# Patient Record
Sex: Female | Born: 1960 | Hispanic: No | Marital: Married | Smoking: Former smoker
Health system: Southern US, Community
[De-identification: ages and names within clinical notes are randomized; demographics above are authoritative.]

## PROBLEM LIST (undated history)

## (undated) DIAGNOSIS — D649 Anemia, unspecified: Secondary | ICD-10-CM

## (undated) HISTORY — PX: BREAST SURGERY: SHX581

---

## 2003-05-03 DIAGNOSIS — I219 Acute myocardial infarction, unspecified: Secondary | ICD-10-CM

## 2003-05-03 HISTORY — DX: Acute myocardial infarction, unspecified: I21.9

## 2012-12-28 ENCOUNTER — Ambulatory Visit: Payer: Self-pay | Admitting: Hematology and Oncology

## 2012-12-28 LAB — CBC CANCER CENTER
Basophil %: 1.3 %
HCT: 34.2 % — ABNORMAL LOW (ref 35.0–47.0)
HGB: 11.2 g/dL — ABNORMAL LOW (ref 12.0–16.0)
Lymphocyte #: 1.8 x10 3/mm (ref 1.0–3.6)
Lymphocyte %: 24.8 %
MCHC: 32.8 g/dL (ref 32.0–36.0)
MCV: 70 fL — ABNORMAL LOW (ref 80–100)
Monocyte #: 0.5 x10 3/mm (ref 0.2–0.9)
Neutrophil #: 4.7 x10 3/mm (ref 1.4–6.5)
Neutrophil %: 63.4 %
Platelet: 263 x10 3/mm (ref 150–440)
RBC: 4.88 10*6/uL (ref 3.80–5.20)
WBC: 7.4 x10 3/mm (ref 3.6–11.0)

## 2012-12-31 ENCOUNTER — Ambulatory Visit: Payer: Self-pay | Admitting: Hematology and Oncology

## 2013-01-02 ENCOUNTER — Encounter: Payer: Self-pay | Admitting: *Deleted

## 2013-01-23 ENCOUNTER — Ambulatory Visit: Payer: Self-pay | Admitting: General Surgery

## 2013-01-25 LAB — CBC CANCER CENTER
Basophil #: 0.1 x10 3/mm (ref 0.0–0.1)
Basophil %: 1.2 %
HCT: 38.4 % (ref 35.0–47.0)
HGB: 12.4 g/dL (ref 12.0–16.0)
Lymphocyte %: 23.9 %
MCH: 23.9 pg — ABNORMAL LOW (ref 26.0–34.0)
MCHC: 32.2 g/dL (ref 32.0–36.0)
Monocyte #: 0.4 x10 3/mm (ref 0.2–0.9)
Monocyte %: 6.2 %
Neutrophil #: 4.2 x10 3/mm (ref 1.4–6.5)
RDW: 20.5 % — ABNORMAL HIGH (ref 11.5–14.5)
WBC: 6.5 x10 3/mm (ref 3.6–11.0)

## 2013-01-25 LAB — RETICULOCYTES: Reticulocyte: 2.01 % (ref 0.4–3.1)

## 2013-01-25 LAB — FERRITIN: Ferritin (ARMC): 364 ng/mL (ref 8–388)

## 2013-01-25 LAB — IRON AND TIBC: Iron Saturation: 54 %

## 2013-01-30 ENCOUNTER — Ambulatory Visit: Payer: Self-pay | Admitting: Hematology and Oncology

## 2013-02-19 LAB — CBC CANCER CENTER
Eosinophil #: 0.2 x10 3/mm (ref 0.0–0.7)
Eosinophil %: 3.3 %
HCT: 40 % (ref 35.0–47.0)
HGB: 13.2 g/dL (ref 12.0–16.0)
Lymphocyte %: 23.7 %
MCH: 26 pg (ref 26.0–34.0)
MCHC: 33.1 g/dL (ref 32.0–36.0)
MCV: 79 fL — ABNORMAL LOW (ref 80–100)
Monocyte #: 0.4 x10 3/mm (ref 0.2–0.9)
Neutrophil #: 3.5 x10 3/mm (ref 1.4–6.5)
Platelet: 235 x10 3/mm (ref 150–440)
RBC: 5.09 10*6/uL (ref 3.80–5.20)
WBC: 5.4 x10 3/mm (ref 3.6–11.0)

## 2013-02-19 LAB — IRON AND TIBC
Iron Saturation: 63 %
Iron: 138 ug/dL (ref 50–170)

## 2013-02-19 LAB — RETICULOCYTES: Absolute Retic Count: 0.0876 10*6/uL (ref 0.019–0.186)

## 2013-02-21 ENCOUNTER — Encounter: Payer: Self-pay | Admitting: *Deleted

## 2013-03-02 ENCOUNTER — Ambulatory Visit: Payer: Self-pay | Admitting: Hematology and Oncology

## 2013-03-22 LAB — CBC CANCER CENTER
Basophil #: 0.1 x10 3/mm (ref 0.0–0.1)
Basophil %: 1.3 %
Eosinophil #: 0.2 x10 3/mm (ref 0.0–0.7)
Eosinophil %: 4 %
HCT: 38.3 % (ref 35.0–47.0)
HGB: 12.5 g/dL (ref 12.0–16.0)
Lymphocyte #: 1.5 x10 3/mm (ref 1.0–3.6)
Lymphocyte %: 24.4 %
MCH: 26.4 pg (ref 26.0–34.0)
MCHC: 32.7 g/dL (ref 32.0–36.0)
Monocyte #: 0.4 x10 3/mm (ref 0.2–0.9)
Monocyte %: 6.4 %
Neutrophil %: 63.9 %
Platelet: 202 x10 3/mm (ref 150–440)
RBC: 4.76 10*6/uL (ref 3.80–5.20)
RDW: 18.3 % — ABNORMAL HIGH (ref 11.5–14.5)
WBC: 6 x10 3/mm (ref 3.6–11.0)

## 2013-03-22 LAB — IRON AND TIBC
Iron Bind.Cap.(Total): 225 ug/dL — ABNORMAL LOW (ref 250–450)
Iron Saturation: 54 %
Iron: 121 ug/dL (ref 50–170)
Unbound Iron-Bind.Cap.: 104 ug/dL

## 2013-03-22 LAB — FERRITIN: Ferritin (ARMC): 545 ng/mL — ABNORMAL HIGH (ref 8–388)

## 2013-03-22 LAB — RETICULOCYTES: Reticulocyte: 1.88 % (ref 0.4–3.1)

## 2013-04-01 ENCOUNTER — Ambulatory Visit: Payer: Self-pay | Admitting: Hematology and Oncology

## 2013-04-08 ENCOUNTER — Ambulatory Visit: Payer: Self-pay | Admitting: Obstetrics and Gynecology

## 2013-04-08 LAB — COMPREHENSIVE METABOLIC PANEL
Albumin: 3.5 g/dL (ref 3.4–5.0)
Alkaline Phosphatase: 81 U/L
Anion Gap: 1 — ABNORMAL LOW (ref 7–16)
Bilirubin,Total: 0.3 mg/dL (ref 0.2–1.0)
Calcium, Total: 9 mg/dL (ref 8.5–10.1)
Chloride: 106 mmol/L (ref 98–107)
Co2: 32 mmol/L (ref 21–32)
EGFR (African American): >60
EGFR (Non-African Amer.): >60
Glucose: 96 mg/dL (ref 65–99)
Potassium: 4 mmol/L (ref 3.5–5.1)
Sodium: 139 mmol/L (ref 136–145)

## 2013-04-08 LAB — CBC
HGB: 13.5 g/dL (ref 12.0–16.0)
MCH: 27.1 pg (ref 26.0–34.0)
MCHC: 33.5 g/dL (ref 32.0–36.0)
MCV: 81 fL (ref 80–100)
Platelet: 207 10*3/uL (ref 150–440)
RBC: 5 10*6/uL (ref 3.80–5.20)
RDW: 16.1 % — ABNORMAL HIGH (ref 11.5–14.5)
WBC: 6.4 10*3/uL (ref 3.6–11.0)

## 2013-04-18 ENCOUNTER — Ambulatory Visit: Payer: Self-pay | Admitting: Obstetrics and Gynecology

## 2013-04-19 LAB — CBC
HCT: 37.3 % (ref 35.0–47.0)
MCH: 27.1 pg (ref 26.0–34.0)
MCV: 81 fL (ref 80–100)
Platelet: 217 10*3/uL (ref 150–440)
RBC: 4.64 10*6/uL (ref 3.80–5.20)
WBC: 11.4 10*3/uL — ABNORMAL HIGH (ref 3.6–11.0)

## 2013-04-19 LAB — BASIC METABOLIC PANEL
Anion Gap: 5 — ABNORMAL LOW (ref 7–16)
BUN: 8 mg/dL (ref 7–18)
Calcium, Total: 8.5 mg/dL (ref 8.5–10.1)
Co2: 28 mmol/L (ref 21–32)
EGFR (African American): >60
Osmolality: 282 (ref 275–301)
Potassium: 3.7 mmol/L (ref 3.5–5.1)
Sodium: 141 mmol/L (ref 136–145)

## 2013-04-19 LAB — MAGNESIUM: Magnesium: 1.6 mg/dL — ABNORMAL LOW

## 2013-04-29 LAB — PATHOLOGY REPORT

## 2013-05-02 HISTORY — PX: ABDOMINAL HYSTERECTOMY: SHX81

## 2013-06-20 ENCOUNTER — Ambulatory Visit: Payer: Self-pay | Admitting: Hematology and Oncology

## 2013-07-05 ENCOUNTER — Ambulatory Visit: Payer: Self-pay | Admitting: Hematology and Oncology

## 2013-07-05 LAB — CBC CANCER CENTER
Basophil #: 0.1 x10 3/mm (ref 0.0–0.1)
Basophil %: 1.1 %
EOS PCT: 6.4 %
Eosinophil #: 0.4 x10 3/mm (ref 0.0–0.7)
HCT: 40.4 % (ref 35.0–47.0)
HGB: 13.3 g/dL (ref 12.0–16.0)
Lymphocyte #: 1.5 x10 3/mm (ref 1.0–3.6)
Lymphocyte %: 24.9 %
MCH: 26.8 pg (ref 26.0–34.0)
MCHC: 33 g/dL (ref 32.0–36.0)
MCV: 82 fL (ref 80–100)
MONO ABS: 0.4 x10 3/mm (ref 0.2–0.9)
MONOS PCT: 6.5 %
Neutrophil #: 3.7 x10 3/mm (ref 1.4–6.5)
Neutrophil %: 61.1 %
PLATELETS: 222 x10 3/mm (ref 150–440)
RBC: 4.95 10*6/uL (ref 3.80–5.20)
RDW: 13.5 % (ref 11.5–14.5)
WBC: 6 x10 3/mm (ref 3.6–11.0)

## 2013-07-05 LAB — IRON AND TIBC
Iron Bind.Cap.(Total): 231 ug/dL — ABNORMAL LOW (ref 250–450)
Iron Saturation: 53 %
Iron: 122 ug/dL (ref 50–170)
Unbound Iron-Bind.Cap.: 109 ug/dL

## 2013-07-05 LAB — FERRITIN: Ferritin (ARMC): 488 ng/mL — ABNORMAL HIGH (ref 8–388)

## 2013-07-05 LAB — RETICULOCYTES
ABSOLUTE RETIC COUNT: 0.0943 10*6/uL (ref 0.019–0.186)
RETICULOCYTE: 1.9 % (ref 0.4–3.1)

## 2013-07-31 ENCOUNTER — Ambulatory Visit: Payer: Self-pay | Admitting: Hematology and Oncology

## 2013-10-03 ENCOUNTER — Ambulatory Visit: Payer: Self-pay | Admitting: Hematology and Oncology

## 2013-10-03 LAB — CBC CANCER CENTER
Basophil #: 0.1 x10 3/mm (ref 0.0–0.1)
Basophil %: 0.7 %
EOS ABS: 0.3 x10 3/mm (ref 0.0–0.7)
Eosinophil %: 3.8 %
HCT: 39.5 % (ref 35.0–47.0)
HGB: 13.3 g/dL (ref 12.0–16.0)
Lymphocyte #: 1.8 x10 3/mm (ref 1.0–3.6)
Lymphocyte %: 23.1 %
MCH: 27.5 pg (ref 26.0–34.0)
MCHC: 33.6 g/dL (ref 32.0–36.0)
MCV: 82 fL (ref 80–100)
MONOS PCT: 6.1 %
Monocyte #: 0.5 x10 3/mm (ref 0.2–0.9)
Neutrophil #: 5.2 x10 3/mm (ref 1.4–6.5)
Neutrophil %: 66.3 %
Platelet: 243 x10 3/mm (ref 150–440)
RBC: 4.83 10*6/uL (ref 3.80–5.20)
RDW: 13.9 % (ref 11.5–14.5)
WBC: 7.9 x10 3/mm (ref 3.6–11.0)

## 2013-10-03 LAB — IRON AND TIBC
IRON BIND. CAP.(TOTAL): 234 ug/dL — AB (ref 250–450)
IRON: 82 ug/dL (ref 50–170)
Iron Saturation: 35 %
Unbound Iron-Bind.Cap.: 152 ug/dL

## 2013-10-30 ENCOUNTER — Ambulatory Visit: Payer: Self-pay | Admitting: Hematology and Oncology

## 2014-03-11 ENCOUNTER — Ambulatory Visit: Payer: Self-pay

## 2014-05-20 ENCOUNTER — Ambulatory Visit (INDEPENDENT_AMBULATORY_CARE_PROVIDER_SITE_OTHER): Payer: No Typology Code available for payment source | Admitting: Podiatry

## 2014-05-20 ENCOUNTER — Encounter: Payer: Self-pay | Admitting: Podiatry

## 2014-05-20 ENCOUNTER — Ambulatory Visit (INDEPENDENT_AMBULATORY_CARE_PROVIDER_SITE_OTHER): Payer: No Typology Code available for payment source

## 2014-05-20 VITALS — BP 122/72 | HR 71 | Resp 16 | Ht 63.0 in | Wt 270.0 lb

## 2014-05-20 DIAGNOSIS — M779 Enthesopathy, unspecified: Secondary | ICD-10-CM

## 2014-05-20 DIAGNOSIS — M722 Plantar fascial fibromatosis: Secondary | ICD-10-CM

## 2014-05-20 DIAGNOSIS — M8430XA Stress fracture, unspecified site, initial encounter for fracture: Secondary | ICD-10-CM

## 2014-05-20 NOTE — Progress Notes (Signed)
   Subjective:    Patient ID: Adrienne Chapman, female    DOB: 06/26/1960, 54 y.o.   MRN: [id number]  HPI Comments: 54 year old female presents the office today with complaints of bilateral heel pain. She states that the left is typically worse in the right however the right tends to get more swollen. She states that she has pain in the mornings or after periods of prolonged activity. She states that once she sits down for a period time is difficult to get up and start ambulating. She states that she has had pain for the last 5 months and has been progressive. She denies any history of injury or trauma. She does that her weight has fluctuated and she recently lost a lot of weight however has since gained some back. She's been soaking her feet in Epson salts. No other treatments. No other complaints at this time.  Foot Pain Associated symptoms include fatigue.      Review of Systems  Constitutional: Positive for fatigue.  Gastrointestinal:       Urgency   Endocrine:       Increase urination   Musculoskeletal:       Joint pain Difficulty walking   Skin:       Change in nails   Hematological: Bruises/bleeds easily.  All other systems reviewed and are negative.      Objective:   Physical Exam AAO 3, NAD DP/PT pulses palpable bilaterally, CRT less than 3 seconds Protective sensation appears to be intact with Dorann OuSimms Weinstein monofilament, vibratory sensation intact, Achilles tendon reflex intact. There is tenderness to palpation overlying the plantar medial tubercle bilateral calcaneus at the insertion the plantar fascia. There is no pain along the posterior aspect of the calcaneus or along the insertion of the Achilles tendon. On the left side there is no pain with lateral compression of the calcaneus and no pain with vibratory sensation. On the right side is pain with lateral compression of the calcaneus there is also discomfort with vibratory sensation. There is overlying edema of  the lateral aspect of the foot on the right side more than the left. No overlying erythema or increase in warmth. No other tenderness to bilateral lower extremities. MMT 5/5, ROM WNL No pain with calf compression, swelling, warmth, erythema. No open lesions or pre-ulcerative lesions are identified.       Assessment & Plan:  54 year old female with right calcaneal stress fracture, left foot plantar fasciitis -X-rays were obtained and reviewed the patient. On the right side there does appear to be a sclerotic line within the calcaneus which is consistent with a stress fracture. No evidence of fracture this time on the left. -Treatment options were discussed including alternatives, risks, complications. -For the left-sided plantar fasciitis discussed icing, stretching exercises. A plantar fascial taping was applied. Discussed possible injection however she wishes to hold off at that time. Discussed shoe gear modifications and possible orthotics. -On the right side given the evidence of stress fracture and x-ray dispensed CAM boot for a prescription for new scooter was also given to her and she states that she would not do well with crutches.  -Follow-up in 2 weeks for repeat x-ray on the right. Will consider MRI at that time. In the meantime, cursory call the office with any questions, concerns, change in symptoms. Follow-up with PCP for other issues mentioned in the review of systems. Also discussed the patient to follow-up with PCP there will other causes of the stress fracture such as osteoporosis.

## 2014-05-20 NOTE — Patient Instructions (Signed)
Only do these exercises on the left foot. Wear the boot on the right due to stress fracture of your heel bone.     Achilles Tendinitis  with Rehab Achilles tendinitis is a disorder of the Achilles tendon. The Achilles tendon connects the large calf muscles (Gastrocnemius and Soleus) to the heel bone (calcaneus). This tendon is sometimes called the heel cord. It is important for pushing-off and standing on your toes and is important for walking, running, or jumping. Tendinitis is often caused by overuse and repetitive microtrauma. SYMPTOMS  Pain, tenderness, swelling, warmth, and redness may occur over the Achilles tendon even at rest.  Pain with pushing off, or flexing or extending the ankle.  Pain that is worsened after or during activity. CAUSES   Overuse sometimes seen with rapid increase in exercise programs or in sports requiring running and jumping.  Poor physical conditioning (strength and flexibility or endurance).  Running sports, especially training running down hills.  Inadequate warm-up before practice or play or failure to stretch before participation.  Injury to the tendon. PREVENTION   Warm up and stretch before practice or competition.  Allow time for adequate rest and recovery between practices and competition.  Keep up conditioning.  Keep up ankle and leg flexibility.  Improve or keep muscle strength and endurance.  Improve cardiovascular fitness.  Use proper technique.  Use proper equipment (shoes, skates).  To help prevent recurrence, taping, protective strapping, or an adhesive bandage may be recommended for several weeks after healing is complete. PROGNOSIS   Recovery may take weeks to several months to heal.  Longer recovery is expected if symptoms have been prolonged.  Recovery is usually quicker if the inflammation is due to a direct blow as compared with overuse or sudden strain. RELATED COMPLICATIONS   Healing time will be prolonged if  the condition is not correctly treated. The injury must be given plenty of time to heal.  Symptoms can reoccur if activity is resumed too soon.  Untreated, tendinitis may increase the risk of tendon rupture requiring additional time for recovery and possibly surgery. TREATMENT   The first treatment consists of rest anti-inflammatory medication, and ice to relieve the pain.  Stretching and strengthening exercises after resolution of pain will likely help reduce the risk of recurrence. Referral to a physical therapist or athletic trainer for further evaluation and treatment may be helpful.  A walking boot or cast may be recommended to rest the Achilles tendon. This can help break the cycle of inflammation and microtrauma.  Arch supports (orthotics) may be prescribed or recommended by your caregiver as an adjunct to therapy and rest.  Surgery to remove the inflamed tendon lining or degenerated tendon tissue is rarely necessary and has shown less than predictable results. MEDICATION   Nonsteroidal anti-inflammatory medications, such as aspirin and ibuprofen, may be used for pain and inflammation relief. Do not take within 7 days before surgery. Take these as directed by your caregiver. Contact your caregiver immediately if any bleeding, stomach upset, or signs of allergic reaction occur. Other minor pain relievers, such as acetaminophen, may also be used.  Pain relievers may be prescribed as necessary by your caregiver. Do not take prescription pain medication for longer than 4 to 7 days. Use only as directed and only as much as you need.  Cortisone injections are rarely indicated. Cortisone injections may weaken tendons and predispose to rupture. It is better to give the condition more time to heal than to use them. HEAT AND  COLD  Cold is used to relieve pain and reduce inflammation for acute and chronic Achilles tendinitis. Cold should be applied for 10 to 15 minutes every 2 to 3 hours for  inflammation and pain and immediately after any activity that aggravates your symptoms. Use ice packs or an ice massage.  Heat may be used before performing stretching and strengthening activities prescribed by your caregiver. Use a heat pack or a warm soak. SEEK MEDICAL CARE IF:  Symptoms get worse or do not improve in 2 weeks despite treatment.  New, unexplained symptoms develop. Drugs used in treatment may produce side effects. EXERCISES RANGE OF MOTION (ROM) AND STRETCHING EXERCISES - Achilles Tendinitis  These exercises may help you when beginning to rehabilitate your injury. Your symptoms may resolve with or without further involvement from your physician, physical therapist or athletic trainer. While completing these exercises, remember:   Restoring tissue flexibility helps normal motion to return to the joints. This allows healthier, less painful movement and activity.  An effective stretch should be held for at least 30 seconds.  A stretch should never be painful. You should only feel a gentle lengthening or release in the stretched tissue. STRETCH - Gastroc, Standing   Place hands on wall.  Extend right / left leg, keeping the front knee somewhat bent.  Slightly point your toes inward on your back foot.  Keeping your right / left heel on the floor and your knee straight, shift your weight toward the wall, not allowing your back to arch.  You should feel a gentle stretch in the right / left calf. Hold this position for __________ seconds. Repeat __________ times. Complete this stretch __________ times per day. STRETCH - Soleus, Standing   Place hands on wall.  Extend right / left leg, keeping the other knee somewhat bent.  Slightly point your toes inward on your back foot.  Keep your right / left heel on the floor, bend your back knee, and slightly shift your weight over the back leg so that you feel a gentle stretch deep in your back calf.  Hold this position for  __________ seconds. Repeat __________ times. Complete this stretch __________ times per day. STRETCH - Gastrocsoleus, Standing  Note: This exercise can place a lot of stress on your foot and ankle. Please complete this exercise only if specifically instructed by your caregiver.   Place the ball of your right / left foot on a step, keeping your other foot firmly on the same step.  Hold on to the wall or a rail for balance.  Slowly lift your other foot, allowing your body weight to press your heel down over the edge of the step.  You should feel a stretch in your right / left calf.  Hold this position for __________ seconds.  Repeat this exercise with a slight bend in your knee. Repeat __________ times. Complete this stretch __________ times per day.  STRENGTHENING EXERCISES - Achilles Tendinitis These exercises may help you when beginning to rehabilitate your injury. They may resolve your symptoms with or without further involvement from your physician, physical therapist or athletic trainer. While completing these exercises, remember:   Muscles can gain both the endurance and the strength needed for everyday activities through controlled exercises.  Complete these exercises as instructed by your physician, physical therapist or athletic trainer. Progress the resistance and repetitions only as guided.  You may experience muscle soreness or fatigue, but the pain or discomfort you are trying to eliminate should never  worsen during these exercises. If this pain does worsen, stop and make certain you are following the directions exactly. If the pain is still present after adjustments, discontinue the exercise until you can discuss the trouble with your clinician. STRENGTH - Plantar-flexors   Sit with your right / left leg extended. Holding onto both ends of a rubber exercise band/tubing, loop it around the ball of your foot. Keep a slight tension in the band.  Slowly push your toes away from  you, pointing them downward.  Hold this position for __________ seconds. Return slowly, controlling the tension in the band/tubing. Repeat __________ times. Complete this exercise __________ times per day.  STRENGTH - Plantar-flexors   Stand with your feet shoulder width apart. Steady yourself with a wall or table using as little support as needed.  Keeping your weight evenly spread over the width of your feet, rise up on your toes.*  Hold this position for __________ seconds. Repeat __________ times. Complete this exercise __________ times per day.  *If this is too easy, shift your weight toward your right / left leg until you feel challenged. Ultimately, you may be asked to do this exercise with your right / left foot only. STRENGTH - Plantar-flexors, Eccentric  Note: This exercise can place a lot of stress on your foot and ankle. Please complete this exercise only if specifically instructed by your caregiver.   Place the balls of your feet on a step. With your hands, use only enough support from a wall or rail to keep your balance.  Keep your knees straight and rise up on your toes.  Slowly shift your weight entirely to your right / left toes and pick up your opposite foot. Gently and with controlled movement, lower your weight through your right / left foot so that your heel drops below the level of the step. You will feel a slight stretch in the back of your calf at the end position.  Use the healthy leg to help rise up onto the balls of both feet, then lower weight only on the right / left leg again. Build up to 15 repetitions. Then progress to 3 consecutive sets of 15 repetitions.*  After completing the above exercise, complete the same exercise with a slight knee bend (about 30 degrees). Again, build up to 15 repetitions. Then progress to 3 consecutive sets of 15 repetitions.* Perform this exercise __________ times per day.  *When you easily complete 3 sets of 15, your physician,  physical therapist or athletic trainer may advise you to add resistance by wearing a backpack filled with additional weight. STRENGTH - Plantar Flexors, Seated   Sit on a chair that allows your feet to rest flat on the ground. If necessary, sit at the edge of the chair.  Keeping your toes firmly on the ground, lift your right / left heel as far as you can without increasing any discomfort in your ankle. Repeat __________ times. Complete this exercise __________ times a day. *If instructed by your physician, physical therapist or athletic trainer, you may add ____________________ of resistance by placing a weighted object on your right / left knee. Document Released: 11/17/2004 Document Revised: 07/11/2011 Document Reviewed: 07/31/2008 [facility] Patient Information 2015 [place], [place]. This information is not intended to replace advice given to you by your health care provider. Make sure you discuss any questions you have with your health care provider. Plantar Fasciitis (Heel Spur Syndrome) with Rehab The plantar fascia is a fibrous, ligament-like, soft-tissue structure that  spans the bottom of the foot. Plantar fasciitis is a condition that causes pain in the foot due to inflammation of the tissue. SYMPTOMS   Pain and tenderness on the underneath side of the foot.  Pain that worsens with standing or walking. CAUSES  Plantar fasciitis is caused by irritation and injury to the plantar fascia on the underneath side of the foot. Common mechanisms of injury include:  Direct trauma to bottom of the foot.  Damage to a small nerve that runs under the foot where the main fascia attaches to the heel bone.  Stress placed on the plantar fascia due to bone spurs. RISK INCREASES WITH:   Activities that place stress on the plantar fascia (running, jumping, pivoting, or cutting).  Poor strength and flexibility.  Improperly fitted shoes.  Tight calf muscles.  Flat feet.  Failure to warm-up  properly before activity.  Obesity. PREVENTION  Warm up and stretch properly before activity.  Allow for adequate recovery between workouts.  Maintain physical fitness:  Strength, flexibility, and endurance.  Cardiovascular fitness.  Maintain a health body weight.  Avoid stress on the plantar fascia.  Wear properly fitted shoes, including arch supports for individuals who have flat feet. PROGNOSIS  If treated properly, then the symptoms of plantar fasciitis usually resolve without surgery. However, occasionally surgery is necessary. RELATED COMPLICATIONS   Recurrent symptoms that may result in a chronic condition.  Problems of the lower back that are caused by compensating for the injury, such as limping.  Pain or weakness of the foot during push-off following surgery.  Chronic inflammation, scarring, and partial or complete fascia tear, occurring more often from repeated injections. TREATMENT  Treatment initially involves the use of ice and medication to help reduce pain and inflammation. The use of strengthening and stretching exercises may help reduce pain with activity, especially stretches of the Achilles tendon. These exercises may be performed at home or with a therapist. Your caregiver may recommend that you use heel cups of arch supports to help reduce stress on the plantar fascia. Occasionally, corticosteroid injections are given to reduce inflammation. If symptoms persist for greater than 6 months despite non-surgical (conservative), then surgery may be recommended.  MEDICATION   If pain medication is necessary, then nonsteroidal anti-inflammatory medications, such as aspirin and ibuprofen, or other minor pain relievers, such as acetaminophen, are often recommended.  Do not take pain medication within 7 days before surgery.  Prescription pain relievers may be given if deemed necessary by your caregiver. Use only as directed and only as much as you  need.  Corticosteroid injections may be given by your caregiver. These injections should be reserved for the most serious cases, because they may only be given a certain number of times. HEAT AND COLD  Cold treatment (icing) relieves pain and reduces inflammation. Cold treatment should be applied for 10 to 15 minutes every 2 to 3 hours for inflammation and pain and immediately after any activity that aggravates your symptoms. Use ice packs or massage the area with a piece of ice (ice massage).  Heat treatment may be used prior to performing the stretching and strengthening activities prescribed by your caregiver, physical therapist, or athletic trainer. Use a heat pack or soak the injury in warm water. SEEK IMMEDIATE MEDICAL CARE IF:  Treatment seems to offer no benefit, or the condition worsens.  Any medications produce adverse side effects. EXERCISES RANGE OF MOTION (ROM) AND STRETCHING EXERCISES - Plantar Fasciitis (Heel Spur Syndrome) These exercises may help  you when beginning to rehabilitate your injury. Your symptoms may resolve with or without further involvement from your physician, physical therapist or athletic trainer. While completing these exercises, remember:   Restoring tissue flexibility helps normal motion to return to the joints. This allows healthier, less painful movement and activity.  An effective stretch should be held for at least 30 seconds.  A stretch should never be painful. You should only feel a gentle lengthening or release in the stretched tissue. RANGE OF MOTION - Toe Extension, Flexion  Sit with your right / left leg crossed over your opposite knee.  Grasp your toes and gently pull them back toward the top of your foot. You should feel a stretch on the bottom of your toes and/or foot.  Hold this stretch for __________ seconds.  Now, gently pull your toes toward the bottom of your foot. You should feel a stretch on the top of your toes and or  foot.  Hold this stretch for __________ seconds. Repeat __________ times. Complete this stretch __________ times per day.  RANGE OF MOTION - Ankle Dorsiflexion, Active Assisted  Remove shoes and sit on a chair that is preferably not on a carpeted surface.  Place right / left foot under knee. Extend your opposite leg for support.  Keeping your heel down, slide your right / left foot back toward the chair until you feel a stretch at your ankle or calf. If you do not feel a stretch, slide your bottom forward to the edge of the chair, while still keeping your heel down.  Hold this stretch for __________ seconds. Repeat __________ times. Complete this stretch __________ times per day.  STRETCH - Gastroc, Standing  Place hands on wall.  Extend right / left leg, keeping the front knee somewhat bent.  Slightly point your toes inward on your back foot.  Keeping your right / left heel on the floor and your knee straight, shift your weight toward the wall, not allowing your back to arch.  You should feel a gentle stretch in the right / left calf. Hold this position for __________ seconds. Repeat __________ times. Complete this stretch __________ times per day. STRETCH - Soleus, Standing  Place hands on wall.  Extend right / left leg, keeping the other knee somewhat bent.  Slightly point your toes inward on your back foot.  Keep your right / left heel on the floor, bend your back knee, and slightly shift your weight over the back leg so that you feel a gentle stretch deep in your back calf.  Hold this position for __________ seconds. Repeat __________ times. Complete this stretch __________ times per day. STRETCH - Gastrocsoleus, Standing  Note: This exercise can place a lot of stress on your foot and ankle. Please complete this exercise only if specifically instructed by your caregiver.   Place the ball of your right / left foot on a step, keeping your other foot firmly on the same  step.  Hold on to the wall or a rail for balance.  Slowly lift your other foot, allowing your body weight to press your heel down over the edge of the step.  You should feel a stretch in your right / left calf.  Hold this position for __________ seconds.  Repeat this exercise with a slight bend in your right / left knee. Repeat __________ times. Complete this stretch __________ times per day.  STRENGTHENING EXERCISES - Plantar Fasciitis (Heel Spur Syndrome)  These exercises may help you when  beginning to rehabilitate your injury. They may resolve your symptoms with or without further involvement from your physician, physical therapist or athletic trainer. While completing these exercises, remember:   Muscles can gain both the endurance and the strength needed for everyday activities through controlled exercises.  Complete these exercises as instructed by your physician, physical therapist or athletic trainer. Progress the resistance and repetitions only as guided. STRENGTH - Towel Curls  Sit in a chair positioned on a non-carpeted surface.  Place your foot on a towel, keeping your heel on the floor.  Pull the towel toward your heel by only curling your toes. Keep your heel on the floor.  If instructed by your physician, physical therapist or athletic trainer, add ____________________ at the end of the towel. Repeat __________ times. Complete this exercise __________ times per day. STRENGTH - Ankle Inversion  Secure one end of a rubber exercise band/tubing to a fixed object (table, pole). Loop the other end around your foot just before your toes.  Place your fists between your knees. This will focus your strengthening at your ankle.  Slowly, pull your big toe up and in, making sure the band/tubing is positioned to resist the entire motion.  Hold this position for __________ seconds.  Have your muscles resist the band/tubing as it slowly pulls your foot back to the starting  position. Repeat __________ times. Complete this exercises __________ times per day.  Document Released: 04/18/2005 Document Revised: 07/11/2011 Document Reviewed: 07/31/2008 [facility] Patient Information 2015 [place], [place]. This information is not intended to replace advice given to you by your health care provider. Make sure you discuss any questions you have with your health care provider.

## 2014-06-05 ENCOUNTER — Ambulatory Visit (INDEPENDENT_AMBULATORY_CARE_PROVIDER_SITE_OTHER): Payer: No Typology Code available for payment source | Admitting: Podiatry

## 2014-06-05 ENCOUNTER — Encounter: Payer: Self-pay | Admitting: Podiatry

## 2014-06-05 ENCOUNTER — Ambulatory Visit (INDEPENDENT_AMBULATORY_CARE_PROVIDER_SITE_OTHER): Payer: No Typology Code available for payment source

## 2014-06-05 VITALS — BP 121/54 | HR 64 | Resp 18

## 2014-06-05 DIAGNOSIS — L988 Other specified disorders of the skin and subcutaneous tissue: Secondary | ICD-10-CM

## 2014-06-05 DIAGNOSIS — M722 Plantar fascial fibromatosis: Secondary | ICD-10-CM

## 2014-06-05 DIAGNOSIS — M8430XA Stress fracture, unspecified site, initial encounter for fracture: Secondary | ICD-10-CM

## 2014-06-05 NOTE — Progress Notes (Signed)
Patient ID: Adrienne MannanFrancesca Chapman, female   DOB: [birth year], 54 y.o.   MRN: [id number]  Subjective: 54 year old female returns the office today for follow-up of bilateral heel pain. She states of the left side is doing well and has improved in symptoms although she does continue to have intermittent discomfort at times. She does that the right heel continues to hurt somewhat as well. She does that she wears the boot the majority the time however she does go without it at times. She was not wearing a today's appointment. On the left side she has been continuing with stretching exercises and icing. No other complaints at this time in no acute changes since last appointment.  Objective: AAO 3, NAD DP/PT pulses palpable bilaterally, CRT less than 3 seconds Protective sensation intact with Simms Weinstein monofilament, vibratory sensation intact, Achilles tendon reflex intact. On the left side there is tenderness palpation overlying the plantar medial tubercle of the calcaneus at the insertion of the plantar fascia. There is no pain on the course of plantar fascial within the arch of the foot. No pain with lateral compression of the calcaneus or pain with vibratory sensation. No pain on the posterior aspect of the calcaneus or along the course/insertion of the Achilles tendon. On the right side there is tenderness palpation overlying the plantar aspect of the calcaneus as well as lateral aspect of the calcaneus. There is tenderness vibratory sensation over on the calcaneus. There is mild overlying the edema however there is no overlying erythema or increase in warmth. No pain along the posterior aspect of the calcaneus. No other areas of tenderness to bilateral lower extremities. There is a small amount of maceration left first interspace. Subjectively she states that her feet sweat a lot. No other areas of rash to bilateral lower extremities. No pain with calf compression, swelling, warmth,  erythema.  Assessment: 54 year old female with bilateral heel pain, left likely plantar fasciitis, right likely stress fracture  Plan: -X-rays were obtained and reviewed of the right foot. There does continue be a sclerotic line within the calcaneus and given her symptoms will continue treatment for stress fracture. Due to these findings however we will obtain an MRI to further evaluate the area. Continue with the Cam Walker on the right side. -On the left continue a stretching exercises as well as icing and the plantar fascial brace. Again offered a steroid injection into the area however she declined. -Discussed with her multiple ways to help control sweating to her feet. Castellani's pain was applied and the left first interspace. -Follow-up after MRI or sooner if any problems are to arise. In the meantime, encouraged to call the office with any questions, concerns, change in symptoms.

## 2014-06-24 ENCOUNTER — Ambulatory Visit (INDEPENDENT_AMBULATORY_CARE_PROVIDER_SITE_OTHER): Payer: No Typology Code available for payment source | Admitting: Podiatry

## 2014-06-24 ENCOUNTER — Encounter: Payer: Self-pay | Admitting: Podiatry

## 2014-06-24 VITALS — BP 135/75 | HR 71 | Resp 18

## 2014-06-24 DIAGNOSIS — M722 Plantar fascial fibromatosis: Secondary | ICD-10-CM

## 2014-06-24 DIAGNOSIS — M8430XD Stress fracture, unspecified site, subsequent encounter for fracture with routine healing: Secondary | ICD-10-CM

## 2014-06-24 MED ORDER — TRIAMCINOLONE ACETONIDE 10 MG/ML IJ SUSP
10.0000 mg | Freq: Once | INTRAMUSCULAR | Status: AC
  Administered 2014-06-24: 10 mg

## 2014-06-24 NOTE — Patient Instructions (Signed)
If you have not heard from someone in regards to your MRI within the next couple of days, please call the office. Continue to wear the boot on the right.

## 2014-06-25 NOTE — Progress Notes (Signed)
Patient ID: Adrienne Chapman, female   DOB: [birth year], 54 y.o.   MRN: [id number]  Subjective: 54 year old female returns the office they for follow-up evaluation of bilateral heel pain. She states that the right side continues to be painful Morton the left. She states that she has pain in the mornings which is relieved by ambulation. She states of the right side is a more constant throbbing. She also states that the right side does have intermittent swelling. She denies any overlying redness or warmth. She wears the cam walker intermittently. She presents today wearing her regular shoe on both feet. She is requesting a steroid injection at this time. Also she states that she never received a call to schedule the MRI. No other complaints at this time in no acute changes since last appointment. Denies any systemic complaints as fevers, chills, nausea, vomiting.  Objective: AAO 3, NAD DP/PT pulses palpable, CRT less than 3 seconds Protective sensation intact with Simms Weinstein monofilament, vibratory sensation intact, Achilles tendon reflex intact. There is tenderness palpation upon the plantar medial tubercle of the calcaneus at the insertion of the plantar fascia on the left foot. There is no pain along the course of plantar fascial in the arch of the foot on the left and the plantar fascia appears to be intact. There is no pain with lateral compression of the calcaneus or pain with vibratory sensation. No pain along the posterior course of the calcaneus or along the course/insertional Achilles tendon. No overlying edema, erythema, increase in warmth. On the right side there is tenderness to palpation along the plantar aspect of the calcaneus. There is also mild tenderness with lateral compression of the calcaneus and there is continued discomfort with vibratory sensation. There is mild overlying edema to the left rear foot. There is no overlying erythema or increase in warmth. No other areas of  tenderness to bilateral lower extremities. MMT 5/5, ROM WNL No pain with calf compression, swelling, warmth, erythema. No open lesions or pre-ulcerative lesions bilaterally.  Assessment: 54 year old female bilateral heel pain. Left-sided likely plantar fasciitis right side without stress fracture.   Plan: -Treatment options were discussed the patient with alternatives, risks, complications. -At this time patient continues Symptoms of stress fracturing right side. Recommended MRI. I re-completed paperwork today for the MRI. Will follow-up. Discussed the patient she has not heard from our office within the next 2 days to call the office to follow-up. For now, continue with CAM walker on the right side at all times. -On the left side symptoms are most likely result of plantar fasciitis. Patient elects to proceed with steroid injection into the left heel. Under sterile skin preparation, a total of 2.5cc of kenalog 10, 0.5% Marcaine plain, and 2% lidocaine plain were infiltrated into the symptomatic area without complication. A band-aid was applied. Patient tolerated the injection well without complication. Post-injection care with discussed with the patient. Discussed with the patient to ice the area over the next couple of days to help prevent a steroid flare. Continue stretching icing activities. The plantar fascial taping was also applied today. Once the tape is removed she can continue with the plantar fascial brace. -Follow-up after MRI. In the meantime, encouraged to call the office with any questions, concerns, change in symptoms.

## 2014-07-01 ENCOUNTER — Other Ambulatory Visit: Payer: Self-pay | Admitting: Podiatry

## 2014-07-01 DIAGNOSIS — M8430XD Stress fracture, unspecified site, subsequent encounter for fracture with routine healing: Secondary | ICD-10-CM

## 2014-07-02 ENCOUNTER — Encounter: Payer: Self-pay | Admitting: Podiatry

## 2014-07-02 NOTE — Progress Notes (Unsigned)
AUTH #  [id number]   FOR MRI OF RIGHT FOOT AND ANKLE GOOD THRU 07/02/14- 08/02/14

## 2014-07-07 ENCOUNTER — Ambulatory Visit
Admission: RE | Admit: 2014-07-07 | Discharge: 2014-07-07 | Disposition: A | Discharge status: Home / Self Care | Payer: No Typology Code available for payment source | Source: Ambulatory Visit | Attending: Podiatry | Admitting: Podiatry

## 2014-07-07 DIAGNOSIS — M8430XD Stress fracture, unspecified site, subsequent encounter for fracture with routine healing: Secondary | ICD-10-CM

## 2014-07-09 ENCOUNTER — Telehealth: Payer: Self-pay | Admitting: Podiatry

## 2014-07-09 NOTE — Telephone Encounter (Signed)
Left message for patient to set up appt with Dr.Wagoner to go over MRI results.

## 2014-07-17 ENCOUNTER — Ambulatory Visit (INDEPENDENT_AMBULATORY_CARE_PROVIDER_SITE_OTHER): Payer: No Typology Code available for payment source | Admitting: Podiatry

## 2014-07-17 VITALS — BP 118/75 | HR 60 | Resp 16

## 2014-07-17 DIAGNOSIS — M722 Plantar fascial fibromatosis: Secondary | ICD-10-CM

## 2014-07-17 MED ORDER — TRIAMCINOLONE ACETONIDE 10 MG/ML IJ SUSP
10.0000 mg | Freq: Once | INTRAMUSCULAR | Status: AC
Start: 2014-07-17 — End: 2014-07-17
  Administered 2014-07-17: 10 mg

## 2014-07-17 MED ORDER — MELOXICAM 7.5 MG PO TABS: 7.5000 mg | ORAL_TABLET | Freq: Every day | ORAL | Status: DC

## 2014-07-17 NOTE — Patient Instructions (Signed)
Plantar Fasciitis (Heel Spur Syndrome) with Rehab The plantar fascia is a fibrous, ligament-like, soft-tissue structure that spans the bottom of the foot. Plantar fasciitis is a condition that causes pain in the foot due to inflammation of the tissue. SYMPTOMS   Pain and tenderness on the underneath side of the foot.  Pain that worsens with standing or walking. CAUSES  Plantar fasciitis is caused by irritation and injury to the plantar fascia on the underneath side of the foot. Common mechanisms of injury include:  Direct trauma to bottom of the foot.  Damage to a small nerve that runs under the foot where the main fascia attaches to the heel bone.  Stress placed on the plantar fascia due to bone spurs. RISK INCREASES WITH:   Activities that place stress on the plantar fascia (running, jumping, pivoting, or cutting).  Poor strength and flexibility.  Improperly fitted shoes.  Tight calf muscles.  Flat feet.  Failure to warm-up properly before activity.  Obesity. PREVENTION  Warm up and stretch properly before activity.  Allow for adequate recovery between workouts.  Maintain physical fitness:  Strength, flexibility, and endurance.  Cardiovascular fitness.  Maintain a health body weight.  Avoid stress on the plantar fascia.  Wear properly fitted shoes, including arch supports for individuals who have flat feet. PROGNOSIS  If treated properly, then the symptoms of plantar fasciitis usually resolve without surgery. However, occasionally surgery is necessary. RELATED COMPLICATIONS   Recurrent symptoms that may result in a chronic condition.  Problems of the lower back that are caused by compensating for the injury, such as limping.  Pain or weakness of the foot during push-off following surgery.  Chronic inflammation, scarring, and partial or complete fascia tear, occurring more often from repeated injections. TREATMENT  Treatment initially involves the use of  ice and medication to help reduce pain and inflammation. The use of strengthening and stretching exercises may help reduce pain with activity, especially stretches of the Achilles tendon. These exercises may be performed at home or with a therapist. Your caregiver may recommend that you use heel cups of arch supports to help reduce stress on the plantar fascia. Occasionally, corticosteroid injections are given to reduce inflammation. If symptoms persist for greater than 6 months despite non-surgical (conservative), then surgery may be recommended.  MEDICATION   If pain medication is necessary, then nonsteroidal anti-inflammatory medications, such as aspirin and ibuprofen, or other minor pain relievers, such as acetaminophen, are often recommended.  Do not take pain medication within 7 days before surgery.  Prescription pain relievers may be given if deemed necessary by your caregiver. Use only as directed and only as much as you need.  Corticosteroid injections may be given by your caregiver. These injections should be reserved for the most serious cases, because they may only be given a certain number of times. HEAT AND COLD  Cold treatment (icing) relieves pain and reduces inflammation. Cold treatment should be applied for 10 to 15 minutes every 2 to 3 hours for inflammation and pain and immediately after any activity that aggravates your symptoms. Use ice packs or massage the area with a piece of ice (ice massage).  Heat treatment may be used prior to performing the stretching and strengthening activities prescribed by your caregiver, physical therapist, or athletic trainer. Use a heat pack or soak the injury in warm water. SEEK IMMEDIATE MEDICAL CARE IF:  Treatment seems to offer no benefit, or the condition worsens.  Any medications produce adverse side effects. EXERCISES RANGE   OF MOTION (ROM) AND STRETCHING EXERCISES - Plantar Fasciitis (Heel Spur Syndrome) These exercises may help you  when beginning to rehabilitate your injury. Your symptoms may resolve with or without further involvement from your physician, physical therapist or athletic trainer. While completing these exercises, remember:   Restoring tissue flexibility helps normal motion to return to the joints. This allows healthier, less painful movement and activity.  An effective stretch should be held for at least 30 seconds.  A stretch should never be painful. You should only feel a gentle lengthening or release in the stretched tissue. RANGE OF MOTION - Toe Extension, Flexion  Sit with your right / left leg crossed over your opposite knee.  Grasp your toes and gently pull them back toward the top of your foot. You should feel a stretch on the bottom of your toes and/or foot.  Hold this stretch for __________ seconds.  Now, gently pull your toes toward the bottom of your foot. You should feel a stretch on the top of your toes and or foot.  Hold this stretch for __________ seconds. Repeat __________ times. Complete this stretch __________ times per day.  RANGE OF MOTION - Ankle Dorsiflexion, Active Assisted  Remove shoes and sit on a chair that is preferably not on a carpeted surface.  Place right / left foot under knee. Extend your opposite leg for support.  Keeping your heel down, slide your right / left foot back toward the chair until you feel a stretch at your ankle or calf. If you do not feel a stretch, slide your bottom forward to the edge of the chair, while still keeping your heel down.  Hold this stretch for __________ seconds. Repeat __________ times. Complete this stretch __________ times per day.  STRETCH - Gastroc, Standing  Place hands on wall.  Extend right / left leg, keeping the front knee somewhat bent.  Slightly point your toes inward on your back foot.  Keeping your right / left heel on the floor and your knee straight, shift your weight toward the wall, not allowing your back to  arch.  You should feel a gentle stretch in the right / left calf. Hold this position for __________ seconds. Repeat __________ times. Complete this stretch __________ times per day. STRETCH - Soleus, Standing  Place hands on wall.  Extend right / left leg, keeping the other knee somewhat bent.  Slightly point your toes inward on your back foot.  Keep your right / left heel on the floor, bend your back knee, and slightly shift your weight over the back leg so that you feel a gentle stretch deep in your back calf.  Hold this position for __________ seconds. Repeat __________ times. Complete this stretch __________ times per day. STRETCH - Gastrocsoleus, Standing  Note: This exercise can place a lot of stress on your foot and ankle. Please complete this exercise only if specifically instructed by your caregiver.   Place the ball of your right / left foot on a step, keeping your other foot firmly on the same step.  Hold on to the wall or a rail for balance.  Slowly lift your other foot, allowing your body weight to press your heel down over the edge of the step.  You should feel a stretch in your right / left calf.  Hold this position for __________ seconds.  Repeat this exercise with a slight bend in your right / left knee. Repeat __________ times. Complete this stretch __________ times per day.    STRENGTHENING EXERCISES - Plantar Fasciitis (Heel Spur Syndrome)  These exercises may help you when beginning to rehabilitate your injury. They may resolve your symptoms with or without further involvement from your physician, physical therapist or athletic trainer. While completing these exercises, remember:   Muscles can gain both the endurance and the strength needed for everyday activities through controlled exercises.  Complete these exercises as instructed by your physician, physical therapist or athletic trainer. Progress the resistance and repetitions only as guided. STRENGTH -  Towel Curls  Sit in a chair positioned on a non-carpeted surface.  Place your foot on a towel, keeping your heel on the floor.  Pull the towel toward your heel by only curling your toes. Keep your heel on the floor.  If instructed by your physician, physical therapist or athletic trainer, add ____________________ at the end of the towel. Repeat __________ times. Complete this exercise __________ times per day. STRENGTH - Ankle Inversion  Secure one end of a rubber exercise band/tubing to a fixed object (table, pole). Loop the other end around your foot just before your toes.  Place your fists between your knees. This will focus your strengthening at your ankle.  Slowly, pull your big toe up and in, making sure the band/tubing is positioned to resist the entire motion.  Hold this position for __________ seconds.  Have your muscles resist the band/tubing as it slowly pulls your foot back to the starting position. Repeat __________ times. Complete this exercises __________ times per day.  Document Released: 04/18/2005 Document Revised: 07/11/2011 Document Reviewed: 07/31/2008 [employer] Patient Information 2015 [employer]. This information is not intended to replace advice given to you by your health care provider. Make sure you discuss any questions you have with your health care provider.  

## 2014-07-17 NOTE — Progress Notes (Signed)
Patient ID: Adrienne Chapman, female   DOB: 1960/06/25, 54 y.o.   MRN: [id number]  Subjective: 54 year old female presents the office today for follow-up evaluation of bilateral heel pain and to discuss MRI results. Although they do continue to be sore at times. She states that she has pain in the bottoms of her heel. She states that we have been aggravate it this past week as she was moving houses. She's been continuing stretching icing x-rays as much as possible. Denies a numbness or tingling. Denies use swelling redness. No other complaints at this time.  Objective: AAO x3, NAD DP/PT pulses palpable bilaterally, CRT less than 3 seconds Protective sensation intact with Simms Weinstein monofilament, vibratory sensation intact, Achilles tendon reflex intact Tenderness to palpation overlying the plantar medial tubercle of the calcaneus to bilateral heels at the insertion of the plantar fascia. There is no pain along the course of plantar fascial within the arch of the foot and the plantar fascia appears intact.  There is no pain with lateral compression of the calcaneus or pain the vibratory sensation today bilaterally. No pain on the posterior aspect of the calcaneus or along the course/insertion of the Achilles tendon. There is mild discomfort along the course the peroneal tendons posterior inferior to the lateral malleolus. There is no pain along the insertion into the fifth metatarsal base. There is no pain with eversion. The peroneal's. Be intact. There is no overlying edema, erythema, increase in warmth. No other areas of tenderness palpation or pain with vibratory sensation to the foot/ankle. MMT 5/5, ROM WNL No open lesions or pre-ulcerative lesions are identified. No pain with calf compression, swelling, warmth, erythema.  MRI right foot/ankle:  1. Mild midfoot degenerative changes but no metatarsal stress fracture findings for inflammatory arthropathy. There is mild intermetatarsal bursitis  between the third and fourth metatarsal heads. 2. Mild peroneal tenosynovitis 3. Plantar fasciitis 4. No stress fracture or osteochondral lesions.  Assessment: 54 year old female bilateral plantar fasciitis  Plan: -MRI results were discussed the patient -Treatment options were discussed include alternatives, risks, complications. -Patient elects to proceed with steroid injection into the bilateral heels. Under sterile skin preparation, a total of 2.5cc of kenalog 10, 0.5% Marcaine plain, and 2% lidocaine plain were infiltrated into the symptomatic area without complication. A band-aid was applied. Patient tolerated the injection well without complication. Post-injection care with discussed with the patient. Discussed with the patient to ice the area over the next couple of days to help prevent a steroid flare.  -Plantar fascial brace was dispensed 2. -Continue stretching activities. -Ice to the area. -Prescribed meloxicam. Discussed side effects and directed to call the office should any occur.  -Discussed shoe gear modifications and possible orthotics. -Follow-up in 3 weeks or sooner if any problems are to arise. In the meantime encouraged to call the office with any questions, concerns, change in symptoms.

## 2014-08-07 ENCOUNTER — Ambulatory Visit: Payer: No Typology Code available for payment source | Admitting: Podiatry

## 2014-08-14 ENCOUNTER — Encounter: Payer: Self-pay | Admitting: Podiatry

## 2014-08-14 ENCOUNTER — Ambulatory Visit (INDEPENDENT_AMBULATORY_CARE_PROVIDER_SITE_OTHER): Payer: No Typology Code available for payment source | Admitting: Podiatry

## 2014-08-14 DIAGNOSIS — M722 Plantar fascial fibromatosis: Secondary | ICD-10-CM

## 2014-08-15 NOTE — Progress Notes (Signed)
Patient ID: Adrienne MannanFrancesca Chapman, female   DOB: 1960-06-06, 54 y.o.   MRN: [id number]  Subjective: 54 year old female presents the office they for follow-up evaluation of bilateral heel pain, plantar fasciitis. She states that since last appointment she had significant improvement in her symptoms and she is able to walk now without pain. She does continue to get some intermittent discomfort times. She continue the icing and stretching activities. She also has been wearing a plantar fascial brace. Denies any acute changes since last appointment and no other complaints at this time.   Objective: AAO x3, NAD DP/PT pulses palpable bilaterally, CRT less than 3 seconds Protective sensation intact with Simms Weinstein monofilament, vibratory sensation intact, Achilles tendon reflex intact There is mild tenderness to palpation overlying the plantar medial tubercle of the calcaneus to bilateral heels at the insertion of the plantar fascia and subjectively she states it is much improved. There is no pain along the course of plantar fascial within the arch of the foot and the plantar fascia appears intact. There is no pain with lateral compression of the calcaneus or pain the vibratory sensation. No pain on the posterior aspect of the calcaneus or along the course/insertion of the Achilles tendon. There is no overlying edema, erythema, increase in warmth. No other areas of tenderness palpation or pain with vibratory sensation to the foot/ankle. Equinus present bilaterally. MMT 5/5, ROM WNL No open lesions or pre-ulcerative lesions are identified. No pain with calf compression, swelling, warmth, erythema.  Assessment: 54 year old female with resolving plantar fasciitis bilaterally  Plan: -Treatment options discussed including alternatives, risks, complications. -At this point she's had 2 steroid injection to the left and 1 steroid injection on the right. She wishes or on any further injections. -Continue plantar  fascial brace -Continue stretching activities -Ice to the area -Dispensed night splint -Discussed shoe gear modifications and orthotics. She will purchase an over-the-counter orthotics at this time. -Follow-up as needed. Discussed with her that the symptoms have not completely resolved in the next 3-4 weeks to call the office follow-up appointment. In the meantime encouraged to call the office with any questions or concerns.

## 2014-08-23 NOTE — Op Note (Signed)
PATIENT NAME:  Adrienne Chapman, HARSEY MR#:  [id number] DATE OF BIRTH:  06-21-1960  DATE OF SURGERY:  04/18/2013  PREOPERATIVE DIAGNOSIS:  Abnormal uterine bleeding.   POSTOPERATIVE DIAGNOSIS:  Abnormal uterine bleeding.   PROCEDURES:  1.  Total laparoscopic hysterectomy.  2.  Bilateral salpingectomy.  3.  Cystoscopy  ANESTHESIA:  General.   SURGEON:  Conard Novak, M.D.  ASSISTANT SURGEON:  Adria Devon, M.D.   ESTIMATED BLOOD LOSS:  150 mL.  OPERATIVE FLUIDS:  1200 mL.   COMPLICATIONS:  None.   FINDINGS:  1.  Normal-appearing uterus, fallopian tubes and ovaries.  2.  A 2 x 1 x 1 cm smooth bordered lesion located just lateral and posterior to the right internal os on the outside of the uterus.  3.  Normal bladder survey without defect and with efflux of urine from the bilateral ureteral orifices on cystoscopy.   SPECIMENS:  1.  Uterus, cervix and fallopian tubes.  2.  Paracervical lesion.   CONDITION AT END OF PROCEDURE:  Stable.   PROCEDURE IN DETAIL:  The patient was taken to the Operating Room where the patient was placed under general anesthesia, which was found to be adequate. She was placed in the dorsal supine lithotomy position in [place] stirrups and prepped and draped in the usual sterile fashion. After a time-out was called, an indwelling Foley catheter was placed in her bladder. A sterile speculum was placed in the vagina and a single-tooth tenaculum was used to grasp the anterior lip of the cervix. The uterus was sounded to a depth of 8 cm. A large V-Care device was then placed on the cervix according to the manufacturer's recommendations after removal of the single-tooth tenaculum. It was affixed in the usual fashion. A vaginal occluder balloon was also affixed to the V-Care device.   Attention was turned to the abdomen where after infiltration of 0.5% Marcaine plain was done, a 10 mm infraumbilical incision was made with a scalpel. The entrance into the abdomen was  gained using direct visualization using an Optiview trocar 10-mm port. Verification of the entry into the abdomen was achieved using opening pressures. The abdomen was then insufflated with CO2 and the camera was then reintroduced with the trocar and abdominal and pelvic survey was undertaken with the above-noted findings. A left lower quadrant 5-mm trocar was placed under direct intra-abdominal camera visualization. A 10-mm right lower quadrant port was placed under direct intra-abdominal camera visualization without difficulty.   Attention was turned to the uterus where after visualization of the bilateral ureters as they crossed the pelvic brim was undertaken. Each ureter was found to be well away from the pedicles of operation. The right fallopian tube was then freed from its attachment starting at the lateral-most portion and working medially along the mesosalpinx until the utero-ovarian ligament could then be transected using the Harmonic to the level of the round ligament. After the round ligament was transected, the anterior portion of the broad ligament was transected anteriorly towards the internal os and a bladder flap was created. The posterior broad ligament was then transected using the Harmonic. The uterine artery on the right side was then cauterized using bipolar electrocautery and transected using the Harmonic. The same procedure was carried out on the left side, and with upward pressure on the V-Care device, the colpotomy was made with the Harmonic and the uterus was removed after circumferential following of the [facility] ring for colpotomy. The uterus and cervix and fallopian tubes were all removed through  the vagina at this point. It should be noted that while creating a colpotomy, a 2 x 1 x 1 smooth bordered nodular lesion was noted and it was removed without difficulty. This was taken as a separate specimen and passed out of the body cavity through the vagina.   All the pedicles were inspected  for hemostasis and this was verified. The vaginal cuff was then closed using 0 Vicryl in a running fashion in a vertical manner. The integrity of this closure was identified after placing a gloved hand in the vagina and palpating along the suture line verifying that there were no leaks or defects and that the strength of the closure was intact.   After desufflation of the abdomen all trocars were removed without difficulty.  The skin was closed using 3-0 Vicryl and Dermabond in a layer at the level of the skin.   Cystoscopy was undertaken to inspect the bladder with using a 70 degree scope. The bladder was distended using approximately 200 mL of sterile saline and the entire bladder was inspected including the dome, the trigone and the lateral edges of the bladder as well as the posterior bladder. Also efflux of urine was noted from each of the ureteral orifices on cystoscopy. The cystoscopy was terminated at this time and the Foley catheter was replaced. The vagina was then inspected and found to be clear of any tissue or sponges or instrumentation.   This terminated the procedure. The patient tolerated the procedure well. Sponge, lap, and needle counts were correct x 2. For antibiotic prophylaxis, the patient received 3 grams of Ancef prior to the beginning of the procedure. For VTE prophylaxis, she was wearing pneumatic compression stockings, which were on and operating throughout the entire procedure. She was awakened in the Operating Room and taken to the recovery area in stable condition.   ____________________________ Conard Novak, MD sdj:jm D: 04/18/2013 15:13:56 ET T: 04/18/2013 16:11:07 ET JOB#: [id number]  cc: Conard Novak, MD, <Dictator> Conard Novak MD ELECTRONICALLY SIGNED 05/17/2013 23:18

## 2014-09-30 ENCOUNTER — Ambulatory Visit (INDEPENDENT_AMBULATORY_CARE_PROVIDER_SITE_OTHER): Payer: No Typology Code available for payment source | Admitting: Podiatry

## 2014-09-30 VITALS — BP 131/89 | HR 60 | Resp 16

## 2014-09-30 DIAGNOSIS — M722 Plantar fascial fibromatosis: Secondary | ICD-10-CM

## 2014-09-30 MED ORDER — DICLOFENAC SODIUM 75 MG PO TBEC: 75.0000 mg | DELAYED_RELEASE_TABLET | Freq: Two times a day (BID) | ORAL | Status: DC

## 2014-09-30 MED ORDER — TRIAMCINOLONE ACETONIDE 10 MG/ML IJ SUSP
10.0000 mg | Freq: Once | INTRAMUSCULAR | Status: AC
Start: 2014-09-30 — End: 2014-09-30
  Administered 2014-09-30: 10 mg

## 2014-09-30 NOTE — Progress Notes (Signed)
Patient ID: Adrienne MannanFrancesca Chapman, female   DOB: 1960-11-03, 54 y.o.   MRN: [id number]  Subjective: 54 year old female presents the office they for follow-up evaluation of bilateral heel pain, plantar fasciitis. She is a question sterile injections to both of her heels today's appointment. She states that since last appointment she continues with stretching, icing activities intermittently as well as when the plantar fascial brace at times. She does continue of pain to both of her heels with a right greater than left. She does that at the injections previously she did have resolution of symptoms have the pain has recurred. She denies any numbness or tingling. She denies any claudication symptoms. Denies any swelling or redness over the area. The pain does not wake her up at night. She says that she has pain on morning or after periods of inactivity which is relieved by ambulation. Also has pain after being on her foot for a period of time. Denies any systemic complaints such as fevers, chills, nausea, vomiting. No acute changes since last appointment, and no other complaints at this time.   Objective: AAO x3, NAD DP/PT pulses palpable bilaterally, CRT less than 3 seconds Protective sensation intact with Simms Weinstein monofilament, vibratory sensation intact, Achilles tendon reflex intact Tenderness to palpation overlying the plantar medial tubercle of the calcaneus to bilateral heels at the insertion of the plantar fascia with the right > left. There is no pain along the course of plantar fascia within the arch of the foot and the plantar fascia appears intact. There is no pain with lateral compression of the calcaneus or pain the vibratory sensation bilaterally. No pain on the posterior aspect of the calcaneus or along the course/insertion of the Achilles tendon. There is no overlying edema, erythema, increase in warmth. No other areas of tenderness palpation or pain with vibratory sensation to the  foot/ankle. MMT 5/5, ROM WNL No open lesions or pre-ulcerative lesions are identified. No pain with calf compression, swelling, warmth, erythema.  Assessment: 54 year old female with bilateral heel pain, plantar fasciitis  Plan: -All treatment options discussed with the patient including all alternatives, risks, complications.  -Patient elects to proceed with steroid injection into the bilateral heel. Under sterile skin preparation, a total of 2.5cc of kenalog 10, 0.5% Marcaine plain, and 2% lidocaine plain were infiltrated into the symptomatic area without complication. A band-aid was applied. Patient tolerated the injection well without complication. Post-injection care with discussed with the patient. Discussed with the patient to ice the area over the next couple of days to help prevent a steroid flare.  -Continue icing, stretching activities on a consistent basis. -Change mobic to voltaren -Continue plantar fascia brace -Again discussed shoe gear modifications not to go barefoot even while at home. Also discuss orthotics. -Follow-up in 3 weeks or sooner if any problems are to arise. At next appointment if symptoms persist we'll likely refer to physical therapy. -Patient encouraged to call the office with any questions, concerns, change in symptoms.

## 2014-10-21 ENCOUNTER — Ambulatory Visit: Payer: No Typology Code available for payment source | Admitting: Podiatry

## 2014-11-25 ENCOUNTER — Ambulatory Visit
Admission: EM | Admit: 2014-11-25 | Discharge: 2014-11-25 | Disposition: A | Discharge status: Home / Self Care | Payer: Worker's Compensation | Attending: Emergency Medicine | Admitting: Emergency Medicine

## 2014-11-25 DIAGNOSIS — L258 Unspecified contact dermatitis due to other agents: Secondary | ICD-10-CM

## 2014-11-25 DIAGNOSIS — L245 Irritant contact dermatitis due to other chemical products: Secondary | ICD-10-CM

## 2014-11-25 MED ORDER — TRIAMCINOLONE ACETONIDE 0.1 % EX CREA: 1.0000 "application " | TOPICAL_CREAM | Freq: Two times a day (BID) | CUTANEOUS | Status: DC

## 2014-11-25 MED ORDER — MUPIROCIN 2 % EX OINT: 1.0000 "application " | TOPICAL_OINTMENT | Freq: Three times a day (TID) | CUTANEOUS | Status: DC

## 2014-11-25 NOTE — ED Notes (Signed)
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 She states that when she got home she noticed the rash around both of her calfs bilaterally. She states that area feels like a sunburn. She states that right leg is worse than left leg.

## 2014-11-25 NOTE — ED Provider Notes (Signed)
CSN: [id number]     Arrival date & time 11/25/14  0913 History   First MD Initiated Contact with Patient 11/25/14 1040     Chief Complaint  Patient presents with  . Work Related Injury  . Rash   (Consider location/radiation/quality/duration/timing/severity/associated sxs/prior Treatment) HPI   This a 54 year old female who at work around 9:00 in the morning when she went through the area. She had pants that were rather long and check them up and became soaked with the solution that was on the floor. Solution appeared to be soapy she has not cervical or the chemical nail been in it. Sidewall taking a bath she noticed this red rash circumferential around her ankles at the same location that the pants were ruled out. Now the area is burning like a sunburn and she thought she noticed some blisters on the area as well. She continued with the pants rolled up throughout the day and even though the pants did dry out eventually.   No past medical history on file.     Past Surgical History  Procedure Laterality Date  . Abdominal hysterectomy  2015   No family history on file. History  Substance Use Topics  . Smoking status: Never Smoker   . Smokeless tobacco: Not on file  . Alcohol Use: No   OB History    No data available     Review of Systems  All other systems reviewed and are negative.   Allergies  Penicillins  Home Medications   Prior to Admission medications   Medication Sig Start Date End Date Taking? Authorizing Provider  diclofenac (VOLTAREN) 75 MG EC tablet Take 1 tablet (75 mg total) by mouth 2 (two) times daily. 09/30/14   Vivi Barrack, DPM  mupirocin ointment (BACTROBAN) 2 % Apply 1 application topically 3 (three) times daily. 11/25/14   Lutricia Feil, PA-C  triamcinolone cream (KENALOG) 0.1 % Apply 1 application topically 2 (two) times daily. 11/25/14   Chrissie Noa Roemer, PA-C   BP 139/58 mmHg  Pulse 67  Temp(Src) 97.2 F (36.2 C) (Tympanic)  Resp 16  Ht 5'  3" (1.6 m)  Wt 266 lb (120.657 kg)  BMI 47.13 kg/m2  SpO2 99%  LMP  (LMP Unknown) Physical Exam  Constitutional: She is oriented to person, place, and time. She appears well-developed and well-nourished.  HENT:  Head: Normocephalic and atraumatic.  Eyes: Pupils are equal, round, and reactive to light.  Neurological: She is alert and oriented to person, place, and time.  Skin: Skin is warm and dry. Rash noted. There is erythema.  Examination of both distal legs shows a circumferential erythematous non-blanchable macular rash that is just proximal to the level of the ankle corresponding where her pants would have rolled up. There is no evidence of blistering or weeping or infection.  Psychiatric: She has a normal mood and affect. Her behavior is normal. Judgment and thought content normal.  Nursing note and vitals reviewed.   ED Course  Procedures (including critical care time) Labs Review Labs Reviewed - No data to display  Imaging Review No results found.   MDM   1. Contact dermatitis due to alkali    Discharge Medication List as of 11/25/2014 11:12 AM    START taking these medications   Details  mupirocin ointment (BACTROBAN) 2 % Apply 1 application topically 3 (three) times daily., Starting 11/25/2014, Until Discontinued, Print    triamcinolone cream (KENALOG) 0.1 % Apply 1 application topically 2 (two) times  daily., Starting 11/25/2014, Until Discontinued, Print      Plan: 1. Diagnosis reviewed with patient 2. rx as per orders; risks, benefits, potential side effects reviewed with patient 3. Recommend supportive treatment with cool compresses 4. F/u prn if symptoms worsen or don't improve     Lutricia Feil, PA-C 11/25/14 1113

## 2014-11-25 NOTE — Discharge Instructions (Signed)

## 2014-12-03 ENCOUNTER — Encounter: Payer: Self-pay | Admitting: Emergency Medicine

## 2014-12-03 ENCOUNTER — Ambulatory Visit
Admission: EM | Admit: 2014-12-03 | Discharge: 2014-12-03 | Disposition: A | Discharge status: Home / Self Care | Payer: Worker's Compensation | Attending: Internal Medicine | Admitting: Internal Medicine

## 2014-12-03 DIAGNOSIS — Z79899 Other long term (current) drug therapy: Secondary | ICD-10-CM | POA: Diagnosis not present

## 2014-12-03 DIAGNOSIS — D692 Other nonthrombocytopenic purpura: Secondary | ICD-10-CM | POA: Insufficient documentation

## 2014-12-03 DIAGNOSIS — R21 Rash and other nonspecific skin eruption: Secondary | ICD-10-CM | POA: Diagnosis present

## 2014-12-03 LAB — COMPREHENSIVE METABOLIC PANEL
ALT: 20 U/L (ref 14–54)
AST: 23 U/L (ref 15–41)
Albumin: 4.3 g/dL (ref 3.5–5.0)
Alkaline Phosphatase: 61 U/L (ref 38–126)
Anion gap: 5 (ref 5–15)
BILIRUBIN TOTAL: 0.5 mg/dL (ref 0.3–1.2)
BUN: 19 mg/dL (ref 6–20)
CO2: 32 mmol/L (ref 22–32)
Calcium: 9.1 mg/dL (ref 8.9–10.3)
Chloride: 102 mmol/L (ref 101–111)
Creatinine, Ser: 0.96 mg/dL (ref 0.44–1.00)
GFR calc Af Amer: >60 mL/min (ref >60)
GFR calc non Af Amer: >60 mL/min (ref >60)
GLUCOSE: 102 mg/dL — AB (ref 65–99)
Potassium: 3.9 mmol/L (ref 3.5–5.1)
SODIUM: 139 mmol/L (ref 135–145)
TOTAL PROTEIN: 8.2 g/dL — AB (ref 6.5–8.1)

## 2014-12-03 LAB — CBC WITH DIFFERENTIAL/PLATELET
Basophils Absolute: 0.1 10*3/uL (ref 0–0.1)
Basophils Relative: 1 %
Eosinophils Absolute: 0.3 10*3/uL (ref 0–0.7)
Eosinophils Relative: 3 %
HCT: 40.4 % (ref 35.0–47.0)
Hemoglobin: 13.3 g/dL (ref 12.0–16.0)
Lymphocytes Relative: 23 %
Lymphs Abs: 1.8 10*3/uL (ref 1.0–3.6)
MCH: 26.5 pg (ref 26.0–34.0)
MCHC: 32.9 g/dL (ref 32.0–36.0)
MCV: 80.6 fL (ref 80.0–100.0)
Monocytes Absolute: 0.4 10*3/uL (ref 0.2–0.9)
Monocytes Relative: 6 %
Neutro Abs: 5.3 10*3/uL (ref 1.4–6.5)
Neutrophils Relative %: 67 %
Platelets: 224 10*3/uL (ref 150–440)
RBC: 5.01 MIL/uL (ref 3.80–5.20)
RDW: 14.5 % (ref 11.5–14.5)
WBC: 7.9 10*3/uL (ref 3.6–11.0)

## 2014-12-03 LAB — URINALYSIS COMPLETE WITH MICROSCOPIC (ARMC ONLY)
BACTERIA UA: NONE SEEN — AB
Bilirubin Urine: NEGATIVE
Glucose, UA: NEGATIVE mg/dL
Hgb urine dipstick: NEGATIVE
LEUKOCYTES UA: NEGATIVE
NITRITE: NEGATIVE
PH: 6.5 (ref 5.0–8.0)
Protein, ur: NEGATIVE mg/dL
RBC / HPF: NONE SEEN RBC/hpf (ref <3)
Specific Gravity, Urine: 1.02 (ref 1.005–1.030)

## 2014-12-03 LAB — SEDIMENTATION RATE: SED RATE: 29 mm/h (ref 0–30)

## 2014-12-03 MED ORDER — PREDNISONE 50 MG PO TABS: 50.0000 mg | ORAL_TABLET | Freq: Every day | ORAL | Status: DC

## 2014-12-03 NOTE — Discharge Instructions (Signed)
Labs today at the urgent care suggest that deep organs are not involved in the vasculitis, just skin and tissues immediately under the skin. Most cases resolve within a few weeks, and prednisone is often helpful in reducing redness/inflammation.   A prescription for prednisone was sent to the [facility] in [place]. If not improving in the next several days, a visit to a dermatologist may be helpful.   Vasculitis Vasculitis is when your blood vessels are inflamed. There are many different blood vessels in the body, and vasculitis can affect any of them. This includes large (veins and arteries) and small (capillaries) vessels. With vasculitis,   Blood vessel walls can become thick.  Blood vessels can become narrow.  Blood vessels can become weak. Sometimes, it becomes so weak that the blood vessel bulges out like a balloon. This is called an aneurysm. Aneurysms are rare but can be life-threatening.  Scarring can occur.  Not enough blood can flow through the blood vessels. All of these things can damage many parts of the body, including the muscles, kidneys, lungs and brain. There are many types of vasculitis. Some types are short-term (acute), while others are long-term (chronic). Some types may go away without treatment, and others may need to be treated for a long time.  CAUSES  Vasculitis occurs when the body's immune system (which fights germs and disease) makes a mistake. It attacks its own blood vessels. This causes inflammation (the body's way of reacting to injury or infection).   Why this happens is usually not known. The condition is then called primary vasculitis.  Sometimes, something triggers the inflammation. This is called secondary vasculitis. Possible causes include:  Infections.  An immune system disease. Examples include lupus, rheumatoid arthritis and scleroderma.  An allergic reaction to a medicine.  Cancer that affects blood cells. This includes leukemia and  lymphoma.  Males and females of all ages and races can develop vasculitis. Some risk factors make vasculitis more likely. These include:  Smoking.  Stress.  Physical injury. SYMPTOMS  There are more than 20 types of vasculitis. Symptoms of each type vary, but some symptoms are common.  Many people with vasculitis:  Have a fever.  Do not feel like eating.  Lose weight.  Feel very tired.  Have aches and pains.  Feel weak.  Start to not have feeling (numbness) in an area.  Symptoms for some types of vasculitis also could be:  Sores in the mouth or eyes.  Skin problems. This could be sores, spots or rashes.  Trouble seeing.  Trouble breathing.  Blood in the urine.  Headaches.  Pain in the abdomen.  Stuffy or bloody nose. DIAGNOSIS  Vasculitis symptoms are similar to symptoms of many other conditions. That can make it hard to tell if you have vasculitis. To be sure, your caregiver will ask about your symptoms and do a physical exam. Certain tests may be necessary, such as:   A complete blood count (CBC). This test shows how many red blood cells are in your blood. Not having enough red blood cells (anemic) can result from vasculitis.  Erythrocyte sedimentation (also called sed rate test). It measures inflammation in the body.  C reactive protein (CRP). This also shows if there is inflammation.  Anti-neutrophil cytoplasmic antibodies (ANCA). This can tell if the immune system is reacting to certain cells in the blood.  A urine test. This checks for blood or protein in the urine. That could be a sign of kidney damage from vasculitis.  Imaging tests. These tests create pictures from inside the body. Options include:  X-rays.  Computed tomography (CT) scan. This uses X-rays guided by a computer.  Ultrasound. It creates an image using sound waves.  Magnetic resonance imaging (MRI). It uses radio waves, magnets and a computer.  Angiography. A dye is put into  your blood vessels. Then, an X-ray is taken of them.  A biopsy of a blood vessel. This means your caregiver will take out a small piece of a blood vessel. Then, it is checked under a microscope. This is an important test. It often is the best way to know for sure if you have vasculitis. TREATMENT   Treatment will depend on the type of vasculitis and how severe it is. Often, you will need to see a specialist in immunologic diseases (rheumatologist).  Some types of vasculitis may go away without treatment.  Some types need only over-the-counter drugs.  Prescription medicines are used to treat many types of vasculitis. For example:  Corticosteroids. These are the drugs used most often. They are very powerful. Usually, a high dose is taken until symptoms improve. Then, the dose is gradually decreased. Using corticosteroids for a long time can cause problems. They can make muscles and bones weak. They can cause blood pressure to go up, and cause diabetes. Also, people often gain weight when they take corticosteroids.  Cytotoxic drugs. These kill cells that cause inflammation. Sometimes, they are used if corticosteroids do not help. Other times, both medications are taken.  Surgery. This may be needed to repair a blood vessel that has bulged out (aneurysm).  Treatment can sometimes cure your disease. Other times, it can put the disease in remission (no symptoms). Increased treatment and reevaluation might be necessary if your disease comes back or flares. HOME CARE INSTRUCTIONS   Take any medications that your caregiver prescribes. Follow the directions carefully.  Watch for any problems that can be caused by a drug (side effects). Tell your caregiver right away if you notice any changes or problems.  Keep all appointments for checkups. This is important to help your caregiver watch for side effects. Checkups may include:  Periodic blood tests.  Bone density testing. This checks how strong or  weak your bones are.  Blood pressure checks. If your blood pressure rises, you may need to take a drug to control it while you are taking corticosteroids.  Blood sugar checks. This is to be sure you are not developing diabetes. If you have diabetes, corticosteroid medications may make it worse and require increased treatment.  Exercise. First, talk with your caregiver about what would be OK for you to do. Aerobic exercise (which increases your heart rate) is often suggested. It includes walking. This type of exercise is good because it helps prevent bone loss. It also helps control your blood pressure.  Follow a healthy diet. Include good sources of protein in your diet. Also include fruits, vegetables and whole grains. Your caregiver can refer you to an expert on healthy eating (dietitian) for more detailed advice.  Learn as much as you can about vasculitis. Understanding your condition can help you cope with it. Coping can be hard because this may be something you will have to live with for years.  Consider joining a support group. It often helps to talk about your worries with others who have the same problems.  Tell your caregiver if you feel stressed, anxious or depressed. Your caregiver may refer you to a specialist, or recommend  medication to relieve your symptoms. SEEK MEDICAL CARE IF:   The symptoms that led to your diagnosis return.  You develop worsening fever, fatigue, headache, weight loss or pain in your jaw.  You develop signs of infection. Infections can be worse if you are on corticosteroid medication.  You develop any new or unexplained symptoms of disease. SEEK IMMEDIATE MEDICAL CARE IF:   Your eyesight changes.  Pain does not go away, even after taking medication.  You feel pain in your chest or abdomen.  You have trouble breathing.  One side of your face or body becomes suddenly weak or numb.  Your nose bleeds.  There is blood in your urine.  You develop a  fever of more than 102 F (38.9 C). Document Released: 02/12/2009 Document Revised: 07/11/2011 Document Reviewed: 06/12/2013 [facility] Patient Information 2015 [employer], [place]. This information is not intended to replace advice given to you by your health care provider. Make sure you discuss any questions you have with your health care provider.

## 2014-12-03 NOTE — ED Notes (Addendum)
Bilateral ankle on rash, still red, itching, burning, warm to the touch. Feels like skin is pulling tight

## 2014-12-03 NOTE — ED Provider Notes (Signed)
CSN: [id number]     Arrival date & time 12/03/14  1557 History   First MD Initiated Contact with Patient 12/03/14 1718     Chief Complaint  Patient presents with  . Rash    1 week follow    HPI  Patient is a 54 year old lady with 10 days history of rash and discomfort in the lower legs, apparent onset after waiting through some water at work. 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 About 10 days ago, the drain was blocked, and she waded through the area, and got her feet and ankles wet, soaked the ankles of her pants. The water was tested, and found to be somewhat alkaline, by her report. No new medications, not taking any supplements, nothing from the health food store. Has some mosquito bites, has not felt like she was coming down with anything. In mid July, had labs done at [facility] primary care in [place], to establish care with a new PCP/Dr Grandis.  Rash on R leg has subsided and is nearly resolved.   L leg rash is still bright red, slightly swollen/tight feeling, uncomfortable. No fevers, no malaise, feels ok otherwise.  History reviewed. No pertinent past medical history. Past Surgical History  Procedure Laterality Date  . Abdominal hysterectomy  2015    History  Substance Use Topics  . Smoking status: Never Smoker   . Smokeless tobacco: Never Used  . Alcohol Use: No    Review of Systems  All other systems reviewed and are negative.   Allergies  Penicillins  Home Medications   Prior to Admission medications   Medication Sig Start Date End Date Taking? Authorizing Provider         mupirocin ointment (BACTROBAN) 2 % Apply 1 application topically 3 (three) times daily. 11/25/14   Lutricia Feil, PA-C  triamcinolone cream (KENALOG) 0.1 % Apply 1 application topically 2 (two) times daily. 11/25/14   Chrissie Noa Roemer, PA-C   BP 120/89 mmHg  Pulse 60  Temp(Src) 97.1 F (36.2 C)  (Tympanic)  Resp 16  Ht 5\' 3"  (1.6 m)  Wt 266 lb (120.657 kg)  BMI 47.13 kg/m2  SpO2 100%  LMP  (LMP Unknown) Physical Exam  Constitutional: She is oriented to person, place, and time. No distress.  Alert, nicely groomed  HENT:  Head: Atraumatic.  Eyes:  Conjugate gaze, no eye redness/drainage  Neck: Neck supple.  Cardiovascular: Normal rate.   Pulmonary/Chest: No respiratory distress.  Abdominal: She exhibits no distension.  Musculoskeletal: Normal range of motion.  No leg swelling  Neurological: She is alert and oriented to person, place, and time.  Skin: Skin is warm and dry.  No cyanosis Irregular deep red nonblanching plaque, 2" high, nearly circles L lower leg above ankle. Sore to touch.  No vesicles, skin intact. Mod hyperpigmentation in an irregular 2-3" band around R lower leg above ankle.  Erythema essentially resolved on R.  Nursing note and vitals reviewed.   ED Course  Procedures  Results for orders placed or performed during the hospital encounter of 12/03/14  Sedimentation rate  Result Value Ref Range   Sed Rate 29 0 - 30 mm/hr  CBC with Differential  Result Value Ref Range   WBC 7.9 3.6 - 11.0 K/uL   RBC 5.01 3.80 - 5.20 MIL/uL   Hemoglobin 13.3 12.0 - 16.0 g/dL   HCT 40.9 81.1 -  47.0 %   MCV 80.6 80.0 - 100.0 fL   MCH 26.5 26.0 - 34.0 pg   MCHC 32.9 32.0 - 36.0 g/dL   RDW 40.9 81.1 - 91.4 %   Platelets 224 150 - 440 K/uL   Neutrophils Relative % 67 %   Neutro Abs 5.3 1.4 - 6.5 K/uL   Lymphocytes Relative 23 %   Lymphs Abs 1.8 1.0 - 3.6 K/uL   Monocytes Relative 6 %   Monocytes Absolute 0.4 0.2 - 0.9 K/uL   Eosinophils Relative 3 %   Eosinophils Absolute 0.3 0 - 0.7 K/uL   Basophils Relative 1 %   Basophils Absolute 0.1 0 - 0.1 K/uL  Urinalysis complete, with microscopic  Result Value Ref Range   Color, Urine YELLOW YELLOW   APPearance CLEAR CLEAR   Glucose, UA NEGATIVE NEGATIVE mg/dL   Bilirubin Urine NEGATIVE NEGATIVE   Ketones, ur TRACE  (A) NEGATIVE mg/dL   Specific Gravity, Urine 1.020 1.005 - 1.030   Hgb urine dipstick NEGATIVE NEGATIVE   pH 6.5 5.0 - 8.0   Protein, ur NEGATIVE NEGATIVE mg/dL   Nitrite NEGATIVE NEGATIVE   Leukocytes, UA NEGATIVE NEGATIVE   RBC / HPF NONE SEEN <3 RBC/hpf   WBC, UA 0-5 <3 WBC/hpf   Bacteria, UA NONE SEEN (A) RARE   Squamous Epithelial / LPF 0-5 (A) RARE  Comprehensive metabolic panel  Result Value Ref Range   Sodium 139 135 - 145 mmol/L   Potassium 3.9 3.5 - 5.1 mmol/L   Chloride 102 101 - 111 mmol/L   CO2 32 22 - 32 mmol/L   Glucose, Bld 102 (H) 65 - 99 mg/dL   BUN 19 6 - 20 mg/dL   Creatinine, Ser 7.82 0.44 - 1.00 mg/dL   Calcium 9.1 8.9 - 95.6 mg/dL   Total Protein 8.2 (H) 6.5 - 8.1 g/dL   Albumin 4.3 3.5 - 5.0 g/dL   AST 23 15 - 41 U/L   ALT 20 14 - 54 U/L   Alkaline Phosphatase 61 38 - 126 U/L   Total Bilirubin 0.5 0.3 - 1.2 mg/dL   GFR calc non Af Amer >60 >60 mL/min   GFR calc Af Amer >60 >60 mL/min   Anion gap 5 5 - 15       MDM   1. Palpable purpura    Normal labs suggest cutaneous only process. Rx for burst of prednisone. Avoid getting feet/legs wet at work. For persistent sx's, dermatology consult may be helpful. Medical Status Questionnaire updated and copies given to patient. Anticipate gradual improvement over the next 10 days.   Eustace Moore, MD 12/03/14 [phone]

## 2015-02-03 ENCOUNTER — Ambulatory Visit: Payer: Worker's Compensation | Admitting: Physical Therapy

## 2015-02-05 ENCOUNTER — Encounter: Payer: Worker's Compensation | Admitting: Physical Therapy

## 2015-02-10 ENCOUNTER — Encounter: Payer: Worker's Compensation | Admitting: Physical Therapy

## 2015-02-12 ENCOUNTER — Encounter: Payer: Worker's Compensation | Admitting: Physical Therapy

## 2017-05-05 ENCOUNTER — Encounter: Payer: Self-pay | Admitting: Obstetrics and Gynecology

## 2017-05-05 ENCOUNTER — Ambulatory Visit: Payer: 59 | Admitting: Obstetrics and Gynecology

## 2017-05-05 VITALS — BP 134/90 | HR 69 | Ht 64.0 in | Wt 272.0 lb

## 2017-05-05 DIAGNOSIS — N952 Postmenopausal atrophic vaginitis: Secondary | ICD-10-CM

## 2017-05-05 DIAGNOSIS — N941 Unspecified dyspareunia: Secondary | ICD-10-CM | POA: Diagnosis not present

## 2017-05-05 MED ORDER — ESTROGENS, CONJUGATED 0.625 MG/GM VA CREA: TOPICAL_CREAM | VAGINAL | 0 refills | Status: DC

## 2017-05-05 NOTE — Patient Instructions (Signed)
Dyspareunia, Female Dyspareunia is pain that is associated with sexual activity. This can affect any part of the genitals or lower abdomen, and there are many possible causes. This condition ranges from mild to severe. Depending on the cause, dyspareunia may get better with treatment, or it may return (recur) over time. What are the causes? The cause of this condition is not always known. Possible causes include:  Cancer.  Psychological factors, such as depression, anxiety, or previous traumatic experiences.  Severe pain and tenderness of the skin around the vagina (vulva) when it is touched (vulvar vestibulitis syndrome).  Infection of the pelvis or the vulva.  Infection of the vagina.  Painful, involuntary tightening (contraction) of the vaginal muscles when anything is put inside the vagina (vaginismus).  Allergic reaction.  Ovarian cysts.  Solid growths of tissue (tumors) in the ovaries or the uterus.  Scar tissue in the ovaries, vagina, or pelvis.  Vaginal dryness.  Thinning of the tissue (atrophy) of the vulva and vagina.  Skin conditions that affect the vulva (vulvar dermatoses), such as lichen sclerosus or lichen planus.  Endometriosis.  Tubal pregnancy.  A tilted uterus.  Uterine prolapse.  Adhesions in the vagina.  Bladder problems.  Intestinal problems.  Certain medicines.  Medical conditions such as diabetes, arthritis, or thyroid disease.  What increases the risk? The following factors may make you more likely to develop this condition:  Having experienced physical or sexual trauma.  Having given birth more than once.  Taking birth control pills.  Having gone through menopause.  Having recently given birth, typically within the past 3-6 months.  Breastfeeding.  What are the signs or symptoms? The main symptom of this condition is pain in any part of the genitals or lower abdomen during or after sexual activity. This may include pain  during sexual arousal, genital stimulation, or orgasm. Pain may get worse when anything is inserted into the vagina, or when the genitals are touched in any way, such as when sitting or wearing pants. Pain can range from mild to severe, depending on the cause of the condition. In some cases, symptoms go away with treatment and return (recur) at a later date. How is this diagnosed? This condition may be diagnosed based on:  Your symptoms, including: ? Where your pain is located. ? When your pain occurs.  Your medical history.  A physical exam. This may include a pelvic exam and a Pap test. This is a screening test that is used to check for signs of cancer of the vagina, cervix, and uterus.  Tests, including: ? Blood tests. ? Ultrasound. This uses sound waves to make a picture of the area that is being tested. ? Urine culture. This test involves checking a urine sample for signs of infection. ? Culture test. This is when your health care provider uses a swab to collect a sample of vaginal fluid. The sample is checked for signs of infection. ? X-rays. ? MRI. ? CT scan. ? Laparoscopy. This is a procedure in which a small incision is made in your lower abdomen and a lighted, pencil-sized instrument (laparoscope) is passed through the incision and used to look inside your pelvis.  You may be referred to a health care provider who specializes in women's health (gynecologist). In some cases, diagnosing the cause of dyspareunia can be difficult. How is this treated? Treatment depends on the cause of your condition and your symptoms. In most cases, you may need to stop sexual activity until your symptoms   improve. Treatment may include:  Lubricants.  Kegel exercises or vaginal dilators.  Medicated skin creams.  Medicated vaginal creams.  Hormonal therapy.  Antibiotic medicine to prevent or fight infection.  Medicines that help to relieve pain.  Medicines that treat depression  (antidepressants).  Psychological counseling.  Sex therapy.  Surgery.  Follow these instructions at home: Lifestyle  Avoid tight clothing and irritating materials around your genital and abdominal area.  Use water-based lubricants as needed. Avoid oil-based lubricants.  Do not use any products that irritate you. This may include certain condoms, spermicides, lubricants, soaps, tampons, vaginal sprays, or douches.  Always practice safe sex. Talk with your health care provider about which form of birth control (contraception) is best for you.  Maintain open communication with your sexual partner. General instructions  Take over-the-counter and prescription medicines only as told by your health care provider.  If you had tests done, it is your responsibility to get your tests results. Ask your health care provider or the department performing the test when your results will be ready.  Urinate before you engage in sexual activity.  Consider joining a support group.  Keep all follow-up visits as told by your health care provider. This is important. Contact a health care provider if:  You develop vaginal bleeding after sexual intercourse.  You develop a lump at the opening of your vagina. Seek medical care even if the lump is painless.  You have: ? Abnormal vaginal discharge. ? Vaginal dryness. ? Itchiness or irritation of your vulva or vagina. ? A new rash. ? Symptoms that get worse or do not improve with treatment. ? A fever. ? Pain when you urinate. ? Blood in your urine. Get help right away if:  You develop severe pain in your abdomen during or shortly after sexual intercourse.  You pass out after having sexual intercourse. This information is not intended to replace advice given to you by your health care provider. Make sure you discuss any questions you have with your health care provider. Document Released: 05/08/2007 Document Revised: 08/28/2015 Document  Reviewed: 11/18/2014 Elsevier Interactive Patient Education  2018 [employer]vier Inc.  

## 2017-05-05 NOTE — Progress Notes (Addendum)
Obstetrics & Gynecology Office Visit   Chief Complaint:  Chief Complaint  Patient presents with  . Pelvic Pain    painful intercourse for spouse    History of Present Illness: 57 year old postmenopausal female presenting for evaluation of painful intercourse.  The patient is status post prior hysterectomy, she does not believe she underwent any concurrent bladder suspensions, anterior or posterior repair.  Operative note reveals uncomplicated TLH, BS for AUB 04/18/2013 by Dr. Jean RosenthalJackson.  Pathology was benign, a small paracervical fibroid was removed concurrently.  Cuff closure was with absorbable suture.  She reports that her husband has felts a lesion which causes him discomfort during intercourse in the past year.  She herself has not noted any discomfort during intercourse.  She has not had any vaginal bleeding, abnormal vaginal discharge.  No changes in bowl or bladder movements.  She has mild intermittent vasomotor symptoms but these have not been perceived as particularly bothersome.   Review of Systems: Review of Systems  Constitutional: Negative for chills, fever, malaise/fatigue and weight loss.  Gastrointestinal: Negative for abdominal pain.  Genitourinary: Negative for dysuria.  Musculoskeletal: Negative for myalgias.  Skin: Negative for itching and rash.     Past Medical History:  No past medical history on file.  Past Surgical History:  Past Surgical History:  Procedure Laterality Date  . ABDOMINAL HYSTERECTOMY  2015    Gynecologic History: No LMP recorded (lmp unknown). Patient has had a hysterectomy.  Obstetric History: [id number]  Family History:  No family history on file.  Social History:  Social History   Socioeconomic History  . Marital status: Married    Spouse name: Not on file  . Number of children: Not on file  . Years of education: Not on file  . Highest education level: Not on file  Social Needs  . Financial resource strain: Not on file  .  Food insecurity - worry: Not on file  . Food insecurity - inability: Not on file  . Transportation needs - medical: Not on file  . Transportation needs - non-medical: Not on file  Occupational History  . Not on file  Tobacco Use  . Smoking status: Never Smoker  . Smokeless tobacco: Never Used  Substance and Sexual Activity  . Alcohol use: No    Alcohol/week: 0.0 oz  . Drug use: No  . Sexual activity: Yes    Birth control/protection: Surgical    Comment: Hysterectomy  Other Topics Concern  . Not on file  Social History Narrative  . Not on file    Allergies:  Allergies  Allergen Reactions  . Penicillins Other (See Comments)    "almost killed me"    Medications: Prior to Admission medications   Medication Sig Start Date End Date Taking? Authorizing Provider  conjugated estrogens (PREMARIN) vaginal cream 1 gram vaginally nightly at bedtime for 2 weeks, 1 gram vaginally every other night at bedtime for 2 weeks, then 1 gram vaginally twice weekly 05/05/17   Vena AustriaStaebler, Lillybeth Tal, MD    Physical Exam Vitals:  Vitals:   05/05/17 0841  BP: 134/90  Pulse: 69   No LMP recorded (lmp unknown). Patient has had a hysterectomy.  General: NAD HEENT: normocephalic, anicteric Thyroid: no enlargement, no palpable nodules Pulmonary: No increased work of breathing Genitourinary:  External: Normal external female genitalia.  Normal urethral meatus, normal  Bartholin's and Skene's glands.    Vagina: Normal vaginal mucosa, no evidence of prolapse.    Cervix: surgically absent,  cuff intact, no masses or lesions  Uterus: surgically absent  Adnexa: ovaries non-enlarged, no adnexal masses  Rectal: deferred  Lymphatic: no evidence of inguinal lymphadenopathy Extremities: no edema, erythema, or tenderness Neurologic: Grossly intact Psychiatric: mood appropriate, affect full  Female chaperone present for pelvic and breast  portions of the physical exam  Assessment: 57 y.o. [id number] presenting  with dyspareunia  Plan: Problem List Items Addressed This Visit    None    Visit Diagnoses    Dyspareunia in female    -  Primary   Vaginal atrophy         1) Dyspareunia - it appear the patient herself is not experiencing dyspareunia but her husband is.  We did discuss that it may be an anatomical problem in her husband rather than herself given normal physical exam findings today.  She does have some mild vulvovaginal atrophy and we discussed the effects of decreasing estrogen levels on vaginal lubrications and pliability.  We will trial the patient on vaginal estrogen and re-evaluate to see if symptoms resolve.  If they do not I would recommend urology follow up for her husband  2) We discussed WHI study findings in detail.  In the combined estrogen-progesterone arm breast cancer risk was increased by 1.26 (CI of 1.00 to 1.59), coronary heart disease 1.29 (CI 1.02-1.63), stroke risk 1.41 (1.07-1.85), and pulmonary embolism 2.13 (CI 1.39-3.25).  That being said the while statistically significant the actual number of cases attributable are relatively small at an addition 8 cases of breast cancer, 7 more coronary artery event, 8 more strokes, and 8 additional case of pulmonary embolism per 10,000 women.  Study was terminated because of the increased breast cancer risk, this was not seen in the progestin only arm of the study for women without an intact uterus.  In addition it is important to note that HRT also had positive or risk reducing effects, and all cause mortality between the HRT/non-HRT users is not statistically different.  Estrogen-progestin HRT decreased the relative risk of hip fracture 0.66 (CI 0.45-0.98), colorectal cancer 0.63 (0.43-0.92).  Current consensus is to limit dose to the lowest effective dose, and shortest treatment duration possible.  Breast cancer risk appeared to increase after 4 years of use.  Also important to note is that these risk refer to systemic HRT for the  treatment of vasomotor symptoms, and do not apply to vaginal preperations with minimal systemic absorption and aimed at treating symptoms of vulvovaginal atrophy.    3) Return in about 8 weeks (around 06/30/2017) for medication follow up.

## 2017-06-30 ENCOUNTER — Ambulatory Visit: Payer: 59 | Admitting: Obstetrics and Gynecology

## 2018-09-26 ENCOUNTER — Other Ambulatory Visit: Payer: Self-pay | Admitting: Sports Medicine

## 2018-09-26 DIAGNOSIS — M25462 Effusion, left knee: Secondary | ICD-10-CM

## 2018-09-26 DIAGNOSIS — G8929 Other chronic pain: Secondary | ICD-10-CM

## 2018-09-26 DIAGNOSIS — M1712 Unilateral primary osteoarthritis, left knee: Secondary | ICD-10-CM

## 2018-10-05 ENCOUNTER — Other Ambulatory Visit: Payer: Self-pay

## 2018-10-05 ENCOUNTER — Ambulatory Visit
Admission: RE | Admit: 2018-10-05 | Discharge: 2018-10-05 | Disposition: A | Discharge status: Home / Self Care | Payer: 59 | Source: Ambulatory Visit | Attending: Sports Medicine | Admitting: Sports Medicine

## 2018-10-05 DIAGNOSIS — M25462 Effusion, left knee: Secondary | ICD-10-CM

## 2018-10-05 DIAGNOSIS — M1712 Unilateral primary osteoarthritis, left knee: Secondary | ICD-10-CM | POA: Diagnosis present

## 2018-10-05 DIAGNOSIS — G8929 Other chronic pain: Secondary | ICD-10-CM | POA: Diagnosis present

## 2018-10-05 DIAGNOSIS — M25562 Pain in left knee: Secondary | ICD-10-CM | POA: Insufficient documentation

## 2018-10-17 ENCOUNTER — Encounter
Admission: RE | Admit: 2018-10-17 | Discharge: 2018-10-17 | Disposition: A | Discharge status: Home / Self Care | Payer: 59 | Source: Ambulatory Visit | Attending: Orthopedic Surgery | Admitting: Orthopedic Surgery

## 2018-10-18 ENCOUNTER — Encounter
Admission: RE | Admit: 2018-10-18 | Discharge: 2018-10-18 | Disposition: A | Discharge status: Home / Self Care | Payer: 59 | Source: Ambulatory Visit | Attending: Orthopedic Surgery | Admitting: Orthopedic Surgery

## 2018-10-18 ENCOUNTER — Other Ambulatory Visit: Payer: Self-pay

## 2018-10-18 ENCOUNTER — Encounter: Admission: RE | Admit: 2018-10-18 | Payer: 59 | Source: Ambulatory Visit

## 2018-10-18 ENCOUNTER — Encounter: Payer: Self-pay | Admitting: *Deleted

## 2018-10-18 DIAGNOSIS — Z1159 Encounter for screening for other viral diseases: Secondary | ICD-10-CM | POA: Insufficient documentation

## 2018-10-18 NOTE — Patient Instructions (Signed)
Your procedure is scheduled on: 10-22-18 MONDAY Report to Same Day Surgery 2nd floor medical mall [facility] Entrance-take elevator on left to 2nd floor.  Check in with surgery information desk.) To find out your arrival time please call [phone] between 1PM - 3PM on 10-19-18 FRIDAY  Remember: Instructions that are not followed completely may result in serious medical risk, up to and including death, or upon the discretion of your surgeon and anesthesiologist your surgery may need to be rescheduled.    _x___ 1. Do not eat food after midnight the night before your procedure. NO GUM OR CANDY AFTER MIDNIGHT. You may drink clear liquids up to 2 hours before you are scheduled to arrive at the hospital for your procedure.  Do not drink clear liquids within 2 hours of your scheduled arrival to the hospital.  Clear liquids include  --Water or Apple juice without pulp  --Clear carbohydrate beverage such as ClearFast or Gatorade  --Black Coffee or Clear Tea (No milk, no creamers, do not add anything to  the coffee or Tea   ____Ensure clear carbohydrate drink on the way to the hospital for bariatric patients  ____Ensure clear carbohydrate drink 3 hours before surgery for Dr Dwyane Luo patients if physician instructed.     __x__ 2. No Alcohol for 24 hours before or after surgery.   __x__3. No Smoking or e-cigarettes for 24 prior to surgery.  Do not use any chewable tobacco products for at least 6 hour prior to surgery   ____  4. Bring all medications with you on the day of surgery if instructed.    __x__ 5. Notify your doctor if there is any change in your medical condition     (cold, fever, infections).    x___6. On the morning of surgery brush your teeth with toothpaste and water.  You may rinse your mouth with mouth wash if you wish.  Do not swallow any toothpaste or mouthwash.   Do not wear jewelry, make-up, hairpins, clips or nail polish.  Do not wear lotions, powders, or perfumes. You  may wear deodorant.  Do not shave 48 hours prior to surgery. Men may shave face and neck.  Do not bring valuables to the hospital.    [facility] is not responsible for any belongings or valuables.               Contacts, dentures or bridgework may not be worn into surgery.  Leave your suitcase in the car. After surgery it may be brought to your room.  For patients admitted to the hospital, discharge time is determined by your treatment team.  _  Patients discharged the day of surgery will not be allowed to drive home.  You will need someone to drive you home and stay with you the night of your procedure.    Please read over the following fact sheets that you were given:   [facility] Preparing for Surgery    ____ Take anti-hypertensive listed below, cardiac, seizure, asthma, anti-reflux and psychiatric medicines. These include:  1. NONE  2.  3.  4.  5.  6.  ____Fleets enema or Magnesium Citrate as directed.   _x___ Use CHG Soap or sage wipes as directed on instruction sheet   ____ Use inhalers on the day of surgery and bring to hospital day of surgery  ____ Stop Metformin and Janumet 2 days prior to surgery.    ____ Take 1/2 of usual insulin dose the night before surgery and  none on the morning surgery.   ____ Follow recommendations from Cardiologist, Pulmonologist or PCP regarding stopping Aspirin, Coumadin, Plavix ,Eliquis, Effient, or Pradaxa, and Pletal.  X____Stop Anti-inflammatories such as Advil, Aleve, Ibuprofen, Motrin, Naproxen,DICLOFENAC, Naprosyn, Goodies powders or aspirin products NOW-OK to take Tylenol    ____ Stop supplements until after surgery.     ____ Bring C-Pap to the hospital.

## 2018-10-19 ENCOUNTER — Other Ambulatory Visit: Payer: Self-pay

## 2018-10-19 ENCOUNTER — Encounter
Admission: RE | Admit: 2018-10-19 | Discharge: 2018-10-19 | Disposition: A | Discharge status: Home / Self Care | Payer: 59 | Source: Ambulatory Visit | Attending: Orthopedic Surgery | Admitting: Orthopedic Surgery

## 2018-10-19 DIAGNOSIS — Z800 Family history of malignant neoplasm of digestive organs: Secondary | ICD-10-CM | POA: Diagnosis not present

## 2018-10-19 DIAGNOSIS — E669 Obesity, unspecified: Secondary | ICD-10-CM | POA: Diagnosis not present

## 2018-10-19 DIAGNOSIS — D649 Anemia, unspecified: Secondary | ICD-10-CM | POA: Diagnosis not present

## 2018-10-19 DIAGNOSIS — Z8349 Family history of other endocrine, nutritional and metabolic diseases: Secondary | ICD-10-CM | POA: Diagnosis not present

## 2018-10-19 DIAGNOSIS — Z820 Family history of epilepsy and other diseases of the nervous system: Secondary | ICD-10-CM | POA: Diagnosis not present

## 2018-10-19 DIAGNOSIS — Z01812 Encounter for preprocedural laboratory examination: Secondary | ICD-10-CM | POA: Insufficient documentation

## 2018-10-19 DIAGNOSIS — X58XXXA Exposure to other specified factors, initial encounter: Secondary | ICD-10-CM | POA: Diagnosis not present

## 2018-10-19 DIAGNOSIS — S83242A Other tear of medial meniscus, current injury, left knee, initial encounter: Secondary | ICD-10-CM | POA: Diagnosis present

## 2018-10-19 DIAGNOSIS — Y990 Civilian activity done for income or pay: Secondary | ICD-10-CM | POA: Diagnosis not present

## 2018-10-19 DIAGNOSIS — Z8249 Family history of ischemic heart disease and other diseases of the circulatory system: Secondary | ICD-10-CM | POA: Diagnosis not present

## 2018-10-19 DIAGNOSIS — Z833 Family history of diabetes mellitus: Secondary | ICD-10-CM | POA: Diagnosis not present

## 2018-10-19 DIAGNOSIS — Z79899 Other long term (current) drug therapy: Secondary | ICD-10-CM | POA: Diagnosis not present

## 2018-10-19 DIAGNOSIS — Z880 Allergy status to penicillin: Secondary | ICD-10-CM | POA: Diagnosis not present

## 2018-10-19 DIAGNOSIS — Z7951 Long term (current) use of inhaled steroids: Secondary | ICD-10-CM | POA: Diagnosis not present

## 2018-10-19 DIAGNOSIS — Z90710 Acquired absence of both cervix and uterus: Secondary | ICD-10-CM | POA: Diagnosis not present

## 2018-10-19 DIAGNOSIS — I252 Old myocardial infarction: Secondary | ICD-10-CM | POA: Diagnosis not present

## 2018-10-19 DIAGNOSIS — S83232A Complex tear of medial meniscus, current injury, left knee, initial encounter: Secondary | ICD-10-CM | POA: Diagnosis not present

## 2018-10-19 DIAGNOSIS — Z6841 Body Mass Index (BMI) 40.0 and over, adult: Secondary | ICD-10-CM | POA: Diagnosis not present

## 2018-10-19 DIAGNOSIS — M1712 Unilateral primary osteoarthritis, left knee: Secondary | ICD-10-CM | POA: Diagnosis not present

## 2018-10-19 LAB — CBC
HCT: 40.8 % (ref 36.0–46.0)
Hemoglobin: 12.9 g/dL (ref 12.0–15.0)
MCH: 26.2 pg (ref 26.0–34.0)
MCHC: 31.6 g/dL (ref 30.0–36.0)
MCV: 82.8 fL (ref 80.0–100.0)
Platelets: 232 10*3/uL (ref 150–400)
RBC: 4.93 MIL/uL (ref 3.87–5.11)
RDW: 15 % (ref 11.5–15.5)
WBC: 6.3 10*3/uL (ref 4.0–10.5)
nRBC: 0 % (ref 0.0–0.2)

## 2018-10-19 LAB — NOVEL CORONAVIRUS, NAA (HOSP ORDER, SEND-OUT TO REF LAB; TAT 18-24 HRS): SARS-CoV-2, NAA: NOT DETECTED

## 2018-10-19 NOTE — Pre-Procedure Instructions (Signed)
MESSAGED DR Ola Spurr REGARDING ABNORMAL EKG THAT WAS DONE TODAY AND NONE FOR COMPARISON-PT HAD MI IN 2005 AND DOES NOT SEE A CARDIOLOGIST AND DOES NOT HAVE STENTS. DR Ola Spurr SAID TO SEND EKG TO PCP SO THAT SHE CAN REVIEW THIS AND HAVE IN THEIR RECORDS AND THAT PT PROBABLY DOES NEED TO F/U WITH CARDIOLOGIST AT SOME POINT DUE TO H/O MI BUT THAT THIS WILL NOT DELAY PTS SURGERY AND THAT SHE CAN PROCEED IF SHE IS NOT HAVING ANY SYMPTOMS. NO SYMPTOMS WHEN I DID PTS PHONE INTERVIEW.  FAXED EKG TO PCP [place]

## 2018-10-21 MED ORDER — CLINDAMYCIN PHOSPHATE 900 MG/50ML IV SOLN
900.0000 mg | Freq: Once | INTRAVENOUS | Status: AC
  Administered 2018-10-22: 900 mg via INTRAVENOUS

## 2018-10-22 ENCOUNTER — Ambulatory Visit: Payer: 59 | Admitting: Anesthesiology

## 2018-10-22 ENCOUNTER — Other Ambulatory Visit: Payer: Self-pay

## 2018-10-22 ENCOUNTER — Encounter: Payer: Self-pay | Admitting: Emergency Medicine

## 2018-10-22 ENCOUNTER — Ambulatory Visit
Admission: RE | Admit: 2018-10-22 | Discharge: 2018-10-22 | Disposition: A | Discharge status: Home / Self Care | Payer: 59 | Attending: Orthopedic Surgery | Admitting: Orthopedic Surgery

## 2018-10-22 ENCOUNTER — Encounter
Admission: RE | Disposition: A | Discharge status: Home / Self Care | Payer: Self-pay | Source: Home / Self Care | Attending: Orthopedic Surgery

## 2018-10-22 DIAGNOSIS — X58XXXA Exposure to other specified factors, initial encounter: Secondary | ICD-10-CM | POA: Insufficient documentation

## 2018-10-22 DIAGNOSIS — Z800 Family history of malignant neoplasm of digestive organs: Secondary | ICD-10-CM | POA: Insufficient documentation

## 2018-10-22 DIAGNOSIS — Y990 Civilian activity done for income or pay: Secondary | ICD-10-CM | POA: Insufficient documentation

## 2018-10-22 DIAGNOSIS — Z90710 Acquired absence of both cervix and uterus: Secondary | ICD-10-CM | POA: Insufficient documentation

## 2018-10-22 DIAGNOSIS — D649 Anemia, unspecified: Secondary | ICD-10-CM | POA: Insufficient documentation

## 2018-10-22 DIAGNOSIS — Z6841 Body Mass Index (BMI) 40.0 and over, adult: Secondary | ICD-10-CM | POA: Insufficient documentation

## 2018-10-22 DIAGNOSIS — I252 Old myocardial infarction: Secondary | ICD-10-CM | POA: Insufficient documentation

## 2018-10-22 DIAGNOSIS — M1712 Unilateral primary osteoarthritis, left knee: Secondary | ICD-10-CM | POA: Insufficient documentation

## 2018-10-22 DIAGNOSIS — Z7951 Long term (current) use of inhaled steroids: Secondary | ICD-10-CM | POA: Insufficient documentation

## 2018-10-22 DIAGNOSIS — Z79899 Other long term (current) drug therapy: Secondary | ICD-10-CM | POA: Insufficient documentation

## 2018-10-22 DIAGNOSIS — Z820 Family history of epilepsy and other diseases of the nervous system: Secondary | ICD-10-CM | POA: Insufficient documentation

## 2018-10-22 DIAGNOSIS — Z880 Allergy status to penicillin: Secondary | ICD-10-CM | POA: Insufficient documentation

## 2018-10-22 DIAGNOSIS — E669 Obesity, unspecified: Secondary | ICD-10-CM | POA: Insufficient documentation

## 2018-10-22 DIAGNOSIS — S83232A Complex tear of medial meniscus, current injury, left knee, initial encounter: Secondary | ICD-10-CM | POA: Diagnosis not present

## 2018-10-22 DIAGNOSIS — Z8349 Family history of other endocrine, nutritional and metabolic diseases: Secondary | ICD-10-CM | POA: Insufficient documentation

## 2018-10-22 DIAGNOSIS — Z8249 Family history of ischemic heart disease and other diseases of the circulatory system: Secondary | ICD-10-CM | POA: Insufficient documentation

## 2018-10-22 DIAGNOSIS — Z833 Family history of diabetes mellitus: Secondary | ICD-10-CM | POA: Insufficient documentation

## 2018-10-22 HISTORY — DX: Anemia, unspecified: D64.9

## 2018-10-22 HISTORY — PX: KNEE ARTHROSCOPY WITH MEDIAL MENISECTOMY: SHX5651

## 2018-10-22 SURGERY — ARTHROSCOPY, KNEE, WITH MEDIAL MENISCECTOMY
Anesthesia: General | Site: Knee | Laterality: Left

## 2018-10-22 MED ORDER — BUPIVACAINE HCL (PF) 0.5 % IJ SOLN
INTRAMUSCULAR | Status: AC
  Filled 2018-10-22: qty 30

## 2018-10-22 MED ORDER — ASPIRIN EC 325 MG PO TBEC: 325.0000 mg | DELAYED_RELEASE_TABLET | Freq: Every day | ORAL | 0 refills | Status: AC

## 2018-10-22 MED ORDER — EPHEDRINE SULFATE 50 MG/ML IJ SOLN
INTRAMUSCULAR | Status: AC
  Filled 2018-10-22: qty 1

## 2018-10-22 MED ORDER — CLINDAMYCIN PHOSPHATE 900 MG/50ML IV SOLN
INTRAVENOUS | Status: AC
  Filled 2018-10-22: qty 50

## 2018-10-22 MED ORDER — ONDANSETRON HCL 4 MG/2ML IJ SOLN
INTRAMUSCULAR | Status: AC
  Filled 2018-10-22: qty 2

## 2018-10-22 MED ORDER — ONDANSETRON 4 MG PO TBDP: 4.0000 mg | ORAL_TABLET | Freq: Three times a day (TID) | ORAL | 0 refills | Status: AC | PRN

## 2018-10-22 MED ORDER — FAMOTIDINE 20 MG PO TABS
20.0000 mg | ORAL_TABLET | Freq: Once | ORAL | Status: AC
  Administered 2018-10-22: 20 mg via ORAL

## 2018-10-22 MED ORDER — MEPERIDINE HCL 50 MG/ML IJ SOLN: 6.2500 mg | INTRAMUSCULAR | Status: DC | PRN

## 2018-10-22 MED ORDER — PROMETHAZINE HCL 25 MG/ML IJ SOLN
INTRAMUSCULAR | Status: AC
  Filled 2018-10-22: qty 1

## 2018-10-22 MED ORDER — LACTATED RINGERS IV SOLN
INTRAVENOUS | Status: DC | PRN
  Administered 2018-10-22: 15:00:00 3000 mL

## 2018-10-22 MED ORDER — LIDOCAINE-EPINEPHRINE 1 %-1:100000 IJ SOLN
INTRAMUSCULAR | Status: AC
  Filled 2018-10-22: qty 1

## 2018-10-22 MED ORDER — MIDAZOLAM HCL 2 MG/2ML IJ SOLN
INTRAMUSCULAR | Status: DC | PRN
  Administered 2018-10-22: 2 mg via INTRAVENOUS

## 2018-10-22 MED ORDER — PROPOFOL 10 MG/ML IV BOLUS
INTRAVENOUS | Status: AC
  Filled 2018-10-22: qty 20

## 2018-10-22 MED ORDER — FENTANYL CITRATE (PF) 100 MCG/2ML IJ SOLN
INTRAMUSCULAR | Status: AC
  Filled 2018-10-22: qty 2

## 2018-10-22 MED ORDER — ONDANSETRON HCL 4 MG/2ML IJ SOLN
INTRAMUSCULAR | Status: DC | PRN
  Administered 2018-10-22: 4 mg via INTRAVENOUS

## 2018-10-22 MED ORDER — DEXMEDETOMIDINE HCL IN NACL 200 MCG/50ML IV SOLN
INTRAVENOUS | Status: DC | PRN
  Administered 2018-10-22 (×2): 8 ug via INTRAVENOUS

## 2018-10-22 MED ORDER — GLYCOPYRROLATE 0.2 MG/ML IJ SOLN
INTRAMUSCULAR | Status: DC | PRN
  Administered 2018-10-22: 0.2 mg via INTRAVENOUS

## 2018-10-22 MED ORDER — IBUPROFEN 800 MG PO TABS: 800.0000 mg | ORAL_TABLET | Freq: Three times a day (TID) | ORAL | 0 refills | Status: AC

## 2018-10-22 MED ORDER — ACETAMINOPHEN 10 MG/ML IV SOLN
INTRAVENOUS | Status: DC | PRN
  Administered 2018-10-22: 1000 mg via INTRAVENOUS

## 2018-10-22 MED ORDER — PROMETHAZINE HCL 25 MG/ML IJ SOLN
6.2500 mg | INTRAMUSCULAR | Status: DC | PRN
  Administered 2018-10-22: 6.25 mg via INTRAVENOUS

## 2018-10-22 MED ORDER — BUPIVACAINE HCL (PF) 0.5 % IJ SOLN
INTRAMUSCULAR | Status: DC | PRN
  Administered 2018-10-22: 6.5 mL

## 2018-10-22 MED ORDER — FENTANYL CITRATE (PF) 100 MCG/2ML IJ SOLN
INTRAMUSCULAR | Status: DC | PRN
  Administered 2018-10-22: 50 ug via INTRAVENOUS
  Administered 2018-10-22 (×2): 25 ug via INTRAVENOUS

## 2018-10-22 MED ORDER — FAMOTIDINE 20 MG PO TABS
ORAL_TABLET | ORAL | Status: AC
  Filled 2018-10-22: qty 1

## 2018-10-22 MED ORDER — LIDOCAINE HCL (CARDIAC) PF 100 MG/5ML IV SOSY
PREFILLED_SYRINGE | INTRAVENOUS | Status: DC | PRN
  Administered 2018-10-22: 100 mg via INTRAVENOUS

## 2018-10-22 MED ORDER — PROPOFOL 10 MG/ML IV BOLUS
INTRAVENOUS | Status: DC | PRN
  Administered 2018-10-22: 150 mg via INTRAVENOUS

## 2018-10-22 MED ORDER — SODIUM CHLORIDE FLUSH 0.9 % IV SOLN
INTRAVENOUS | Status: AC
  Filled 2018-10-22: qty 10

## 2018-10-22 MED ORDER — LIDOCAINE-EPINEPHRINE 1 %-1:100000 IJ SOLN
INTRAMUSCULAR | Status: DC | PRN
  Administered 2018-10-22: 6.5 mL

## 2018-10-22 MED ORDER — MIDAZOLAM HCL 2 MG/2ML IJ SOLN
INTRAMUSCULAR | Status: AC
  Filled 2018-10-22: qty 2

## 2018-10-22 MED ORDER — FENTANYL CITRATE (PF) 100 MCG/2ML IJ SOLN
INTRAMUSCULAR | Status: AC
  Administered 2018-10-22: 25 ug via INTRAVENOUS
  Filled 2018-10-22: qty 2

## 2018-10-22 MED ORDER — OXYCODONE HCL 5 MG/5ML PO SOLN: 5.0000 mg | Freq: Once | ORAL | Status: DC | PRN

## 2018-10-22 MED ORDER — OXYCODONE HCL 5 MG PO TABS: 5.0000 mg | ORAL_TABLET | Freq: Once | ORAL | Status: DC | PRN

## 2018-10-22 MED ORDER — FENTANYL CITRATE (PF) 100 MCG/2ML IJ SOLN
25.0000 ug | INTRAMUSCULAR | Status: DC | PRN
  Administered 2018-10-22 (×4): 25 ug via INTRAVENOUS

## 2018-10-22 MED ORDER — ACETAMINOPHEN 10 MG/ML IV SOLN
INTRAVENOUS | Status: AC
  Filled 2018-10-22: qty 100

## 2018-10-22 MED ORDER — HYDROCODONE-ACETAMINOPHEN 5-325 MG PO TABS: 1.0000 | ORAL_TABLET | ORAL | 0 refills | Status: AC | PRN

## 2018-10-22 MED ORDER — LACTATED RINGERS IV SOLN
INTRAVENOUS | Status: DC
  Administered 2018-10-22: 13:00:00 via INTRAVENOUS

## 2018-10-22 MED ORDER — ACETAMINOPHEN 500 MG PO TABS: 1000.0000 mg | ORAL_TABLET | Freq: Three times a day (TID) | ORAL | 2 refills | Status: AC

## 2018-10-22 SURGICAL SUPPLY — 57 items
ADAPTER IRRIG TUBE 2 SPIKE SOL (ADAPTER) ×6 IMPLANT
BANDAGE ACE 6X5 VEL STRL LF (GAUZE/BANDAGES/DRESSINGS) ×3 IMPLANT
BLADE SURG SZ11 CARB STEEL (BLADE) ×3 IMPLANT
BNDG COHESIVE 6X5 TAN STRL LF (GAUZE/BANDAGES/DRESSINGS) ×3 IMPLANT
BNDG ESMARK 6X12 TAN STRL LF (GAUZE/BANDAGES/DRESSINGS) ×3 IMPLANT
BUR RADIUS 3.5 (BURR) IMPLANT
BUR RADIUS 4.0X18.5 (BURR) IMPLANT
CAST PADDING 6X4YD ST 30248 (SOFTGOODS) ×2
CHLORAPREP W/TINT 26 (MISCELLANEOUS) ×3 IMPLANT
CLOSURE WOUND 1/2 X4 (GAUZE/BANDAGES/DRESSINGS)
COOLER POLAR GLACIER W/PUMP (MISCELLANEOUS) ×3 IMPLANT
COVER WAND RF STERILE (DRAPES) ×3 IMPLANT
CUFF TOURN SGL QUICK 24 (TOURNIQUET CUFF)
CUFF TOURN SGL QUICK 30 (TOURNIQUET CUFF)
CUFF TRNQT CYL 24X4X16.5-23 (TOURNIQUET CUFF) IMPLANT
CUFF TRNQT CYL 30X4X21-28X (TOURNIQUET CUFF) IMPLANT
DEVICE SUCT BLK HOLE OR FLOOR (MISCELLANEOUS) ×3 IMPLANT
DRAPE IMP U-DRAPE 54X76 (DRAPES) ×3 IMPLANT
DRAPE LEGGINS SURG 28X43 STRL (DRAPES) IMPLANT
ELECT REM PT RETURN 9FT ADLT (ELECTROSURGICAL)
ELECTRODE REM PT RTRN 9FT ADLT (ELECTROSURGICAL) IMPLANT
GAUZE SPONGE 4X4 12PLY STRL (GAUZE/BANDAGES/DRESSINGS) ×3 IMPLANT
GAUZE XEROFORM 1X8 LF (GAUZE/BANDAGES/DRESSINGS) ×2 IMPLANT
GLOVE BIOGEL PI IND STRL 8 (GLOVE) ×1 IMPLANT
GLOVE BIOGEL PI INDICATOR 8 (GLOVE) ×2
GLOVE SURG ORTHO 8.0 STRL STRW (GLOVE) ×6 IMPLANT
GOWN STRL REUS W/ TWL LRG LVL3 (GOWN DISPOSABLE) ×1 IMPLANT
GOWN STRL REUS W/ TWL XL LVL3 (GOWN DISPOSABLE) ×1 IMPLANT
GOWN STRL REUS W/TWL LRG LVL3 (GOWN DISPOSABLE) ×2
GOWN STRL REUS W/TWL XL LVL3 (GOWN DISPOSABLE) ×2
IV LACTATED RINGER IRRG 3000ML (IV SOLUTION) ×8
IV LR IRRIG 3000ML ARTHROMATIC (IV SOLUTION) ×4 IMPLANT
KIT TURNOVER KIT A (KITS) ×3 IMPLANT
MANIFOLD NEPTUNE II (INSTRUMENTS) ×3 IMPLANT
MAT ABSORB  FLUID 56X50 GRAY (MISCELLANEOUS) ×4
MAT ABSORB FLUID 56X50 GRAY (MISCELLANEOUS) ×2 IMPLANT
NDL MAYO CATGUT SZ5 (NEEDLE)
NDL SUT 5 .5 CRC TPR PNT MAYO (NEEDLE) IMPLANT
NEEDLE HYPO 22GX1.5 SAFETY (NEEDLE) ×3 IMPLANT
PACK ARTHROSCOPY KNEE (MISCELLANEOUS) ×3 IMPLANT
PAD ABD DERMACEA PRESS 5X9 (GAUZE/BANDAGES/DRESSINGS) ×6 IMPLANT
PAD WRAPON POLAR KNEE (MISCELLANEOUS) ×1 IMPLANT
PADDING CAST COTTON 6X4 ST (SOFTGOODS) ×1 IMPLANT
PENCIL ELECTRO HAND CTR (MISCELLANEOUS) IMPLANT
SET TUBE SUCT SHAVER OUTFL 24K (TUBING) ×3 IMPLANT
SET TUBE TIP INTRA-ARTICULAR (MISCELLANEOUS) ×3 IMPLANT
STRIP CLOSURE SKIN 1/2X4 (GAUZE/BANDAGES/DRESSINGS) IMPLANT
SUT ETHILON 3-0 FS-10 30 BLK (SUTURE) ×3
SUT MNCRL AB 4-0 PS2 18 (SUTURE) IMPLANT
SUT VIC AB 0 CT2 27 (SUTURE) IMPLANT
SUT VIC AB 2-0 CT2 27 (SUTURE) IMPLANT
SUTURE EHLN 3-0 FS-10 30 BLK (SUTURE) ×1 IMPLANT
TOWEL OR 17X26 4PK STRL BLUE (TOWEL DISPOSABLE) ×6 IMPLANT
TUBING ARTHRO INFLOW-ONLY STRL (TUBING) ×3 IMPLANT
WAND HAND CNTRL MULTIVAC 50 (MISCELLANEOUS) IMPLANT
WAND WEREWOLF FLOW 90D (MISCELLANEOUS) IMPLANT
WRAPON POLAR PAD KNEE (MISCELLANEOUS) ×3

## 2018-10-22 NOTE — Anesthesia Post-op Follow-up Note (Signed)
Anesthesia QCDR form completed.        

## 2018-10-22 NOTE — Anesthesia Preprocedure Evaluation (Signed)
Anesthesia Evaluation  Patient identified by MRN, date of birth, ID band Patient awake    Reviewed: Allergy & Precautions, NPO status , Patient's Chart, lab work & pertinent test results  History of Anesthesia Complications Negative for: history of anesthetic complications  Airway Mallampati: II  TM Distance: >3 FB Neck ROM: Full    Dental  (+) Poor Dentition   Pulmonary neg sleep apnea, neg COPD, former smoker,    breath sounds clear to auscultation- rhonchi (-) wheezing      Cardiovascular (-) angina+ CAD and + Past MI  (-) Cardiac Stents and (-) CABG  Rhythm:Regular Rate:Normal - Systolic murmurs and - Diastolic murmurs    Neuro/Psych neg Seizures negative neurological ROS  negative psych ROS   GI/Hepatic negative GI ROS, Neg liver ROS,   Endo/Other  negative endocrine ROSneg diabetes  Renal/GU negative Renal ROS     Musculoskeletal negative musculoskeletal ROS (+)   Abdominal (+) + obese,   Peds  Hematology  (+) anemia ,   Anesthesia Other Findings Past Medical History: No date: Anemia     Comment:  h/o 2005: Myocardial infarction ([place])   Reproductive/Obstetrics                             Anesthesia Physical Anesthesia Plan  ASA: III  Anesthesia Plan: General   Post-op Pain Management:    Induction: Intravenous  PONV Risk Score and Plan: 2 and Ondansetron and Midazolam  Airway Management Planned: LMA  Additional Equipment:   Intra-op Plan:   Post-operative Plan:   Informed Consent: I have reviewed the patients History and Physical, chart, labs and discussed the procedure including the risks, benefits and alternatives for the proposed anesthesia with the patient or authorized representative who has indicated his/her understanding and acceptance.     Dental advisory given  Plan Discussed with: CRNA and Anesthesiologist  Anesthesia Plan Comments:          Anesthesia Quick Evaluation

## 2018-10-22 NOTE — Op Note (Addendum)
Operative Note    SURGERY DATE: 10/22/2018   PRE-OP DIAGNOSIS:  1. Left medial meniscus tear   POST-OP DIAGNOSIS:  1. Left medial meniscus tear 2. Left tricompartmental degenerative changes   PROCEDURES:  1.  Left knee arthroscopy, partial medial meniscectomy   SURGEON: Rosealee AlbeeSunny H. Jamera Vanloan, MD   ANESTHESIA: Gen   ESTIMATED BLOOD LOSS: minimal   TOTAL IV FLUIDS: per anesthesia   INDICATION(S):  Adrienne Chapman is a 58 y.o. female with signs and symptoms as well as MRI finding of medial meniscus tear.  Her symptoms began approximately 2.5 months ago after she had an acute injury at work.  After discussion of risks, benefits, and alternatives to surgery, the patient elected to proceed.   OPERATIVE FINDINGS:    Examination under anesthesia: A careful examination under anesthesia was performed.  Passive range of motion was: Hyperextension: 2.  Extension: 0.  Flexion: 120.  Lachman: normal. Pivot Shift: normal.  Posterior drawer: normal.  Varus stability in full extension: normal.  Varus stability in 30 degrees of flexion: normal.  Valgus stability in full extension: normal.  Valgus stability in 30 degrees of flexion: normal.   Intra-operative findings: A thorough arthroscopic examination of the knee was performed.  The findings are: 1. Suprapatellar pouch: Normal 2. Undersurface of median ridge: Grade 2 degenerative changes 3. Medial patellar facet:  Areas of grade 2 degenerative changes 4. Lateral patellar facet: Grade 1 softening 5. Trochlea: Grade 2 degenerative changes centrally 6. Lateral gutter/popliteus tendon: Normal 7. Hoffa's fat pad: Inflamed 8. Medial gutter/plica: Normal 9. ACL: Normal 10. PCL: Normal 11. Medial meniscus: Complex tear with primarily radial tear near the posterior horn/body junction with flipped meniscus fragment into the tibial recess.  There was an additional horizontal/oblique tear of the posterior horn. 12. Medial compartment cartilage: Focal areas  of grade 2 degenerative changes to the medial femoral condyle and tibial plateau 13. Lateral meniscus: Normal 14. Lateral compartment cartilage:  Focal areas of grade 2 changes to the tibial plateau, lateral femoral condyle cartilage is normal   OPERATIVE REPORT:     I identified Adrienne Chapman in the pre-operative holding area. I marked the operative knee with my initials. I reviewed the risks and benefits of the proposed surgical intervention and the patient (and/or patient's guardian) wished to proceed. The patient was transferred to the operative suite and placed in the supine position with all bony prominences padded.  Anesthesia was administered. Appropriate IV antibiotics were administered prior to incision. The extremity was then prepped and draped in standard fashion. A time out was performed confirming the correct extremity, correct patient, and correct procedure.   Arthroscopy portals were marked. Local anesthetic was injected to the planned portal sites. The anterolateral portal was established with an 11 blade.      The arthroscope was placed in the anterolateral portal and then into the suprapatellar pouch.  Next the medial portal was established under needle localization. The MCL was pie-crusted to improve visualization of the posterior horn. A diagnostic knee scope was completed with the above findings. The medial meniscus tear was identified.   The flipped fragment of the medial meniscus was reduced the probe.  The meniscal tear was debrided using an arthroscopic biter and an oscillating shaver until the meniscus had stable borders.  After appropriate debridement, approximately 60% of the posterior horn/body junction width remained.  Arthroscopic fluid was removed from the joint.   The portals were closed with 3-0 Nylon suture. Sterile dressings included Xeroform,  4x4s, Sof-Rol, and Bias wrap. A Polarcare was placed.  The patient was then awakened and taken to the PACU  hemodynamically stable without complication.     POSTOPERATIVE PLAN: The patient will be discharged home today once they meet PACU criteria. Aspirin 325 mg daily was prescribed for 2 weeks for DVT prophylaxis.  Physical therapy will start on POD#3-4. Weight-bearing as tolerated. Follow up in 2 weeks per protocol.

## 2018-10-22 NOTE — Anesthesia Procedure Notes (Addendum)
Procedure Name: LMA Insertion Date/Time: 10/22/2018 2:43 PM Performed by: Lavone Orn, CRNA Pre-anesthesia Checklist: Patient identified, Emergency Drugs available, Suction available, Patient being monitored and Timeout performed Patient Re-evaluated:Patient Re-evaluated prior to induction Oxygen Delivery Method: Circle system utilized Preoxygenation: Pre-oxygenation with 100% oxygen Induction Type: IV induction Ventilation: Mask ventilation without difficulty LMA: LMA inserted Number of attempts: 1 Placement Confirmation: positive ETCO2 and breath sounds checked- equal and bilateral ETT to lip (cm): At lip. Tube secured with: Tape Dental Injury: Teeth and Oropharynx as per pre-operative assessment

## 2018-10-22 NOTE — Transfer of Care (Signed)
Immediate Anesthesia Transfer of Care Note  Patient: Adrienne Chapman  Procedure(s) Performed: LEFT KNEE ARTHROSCOPY WITH PARTIAL MEDIAL MENISECTOMY (Left Knee)  Patient Location: PACU  Anesthesia Type:General  Level of Consciousness: drowsy  Airway & Oxygen Therapy: Patient Spontanous Breathing and Patient connected to face mask oxygen  Post-op Assessment: Report given to RN and Post -op Vital signs reviewed and stable  Post vital signs: stable  Last Vitals:  Vitals Value Taken Time  BP 135/75 10/22/18 1536  Temp    Pulse 74 10/22/18 1538  Resp 16 10/22/18 1538  SpO2 100 % 10/22/18 1538  Vitals shown include unvalidated device data.  Last Pain:  Vitals:   10/22/18 1246  TempSrc: Tympanic  PainSc: 6          Complications: No apparent anesthesia complications

## 2018-10-22 NOTE — Discharge Instructions (Signed)
Arthroscopic Knee Surgery - Partial Meniscectomy   Post-Op Instructions   1. Bracing or crutches: Crutches will be provided at the time of discharge from the surgery center if you do not already have them.   2. Ice: You may be provided with a device (Polar Care) that allows you to ice the affected area effectively. Otherwise you can ice manually.    3. Driving:  Plan on not driving for at least two weeks. Please note that you are advised NOT to drive while taking narcotic pain medications as you may be impaired and unsafe to drive.   4. Activity: Ankle pumps several times an hour while awake to prevent blood clots. Weight bearing: as tolerated. Use crutches for as needed (usually ~1 week or less) until pain allows you to ambulate without a limp. Bending and straightening the knee is unlimited. Elevate knee above heart level as much as possible for one week. Avoid standing more than 5 minutes (consecutively) for the first week.  Avoid long distance travel for 2 weeks.  5. Medications:  - You have been provided a prescription for narcotic pain medicine. After surgery, take 1-2 narcotic tablets every 4 hours if needed for severe pain.  - You may take up to 3000mg/day of tylenol (acetaminophen). You can take 1000mg 3x/day. Please check your narcotic. If you have acetaminophen in your narcotic (each tablet will be 325mg), be careful not to exceed a total of 3000mg/day of acetaminophen.  - A prescription for anti-nausea medication will be provided in case the narcotic medicine or anesthesia causes nausea - take 1 tablet every 6 hours only if nauseated.  - Take ibuprofen 800 mg every 8 hours WITH food to reduce post-operative knee swelling. DO NOT STOP IBUPROFEN POST-OP UNTIL INSTRUCTED TO DO SO at first post-op office visit (10-14 days after surgery). However, please discontinue if you have any abdominal discomfort after taking this.  - Take enteric coated aspirin 325 mg once daily for 2 weeks to prevent  blood clots.    6. Bandages: The physical therapist should change the bandages at the first post-op appointment. If needed, the dressing supplies have been provided to you.   7. Physical Therapy: 1-2 times per week for 6 weeks. Therapy typically starts on post operative Day 3 or 4. You have been provided an order for physical therapy. The therapist will provide home exercises.   8. Work: May return to full work usually around 2 weeks after 1st post-operative visit. May do light duty/desk job in approximately 1-2 weeks when off of narcotics, pain is well-controlled, and swelling has decreased. Labor intensive jobs may require 4-6 weeks to return.      9. Post-Op Appointments: Your first post-op appointment will be with Dr. Patel in approximately 2 weeks time.    If you find that they have not been scheduled please call the Orthopaedic Appointment front desk at [phone].  AMBULATORY SURGERY  DISCHARGE INSTRUCTIONS   1) The drugs that you were given will stay in your system until tomorrow so for the next 24 hours you should not:  A) Drive an automobile B) Make any legal decisions C) Drink any alcoholic beverage   2) You may resume regular meals tomorrow.  Today it is better to start with liquids and gradually work up to solid foods.  You may eat anything you prefer, but it is better to start with liquids, then soup and crackers, and gradually work up to solid foods.   3) Please notify your   doctor immediately if you have any unusual bleeding, trouble breathing, redness and pain at the surgery site, drainage, fever, or pain not relieved by medication.    4) Additional Instructions:        Please contact your physician with any problems or Same Day Surgery at [phone], Monday through Friday 6 am to 4 pm, or [facility] [place] Main number at [phone].  

## 2018-10-22 NOTE — Anesthesia Postprocedure Evaluation (Signed)
Anesthesia Post Note  Patient: Adrienne Chapman  Procedure(s) Performed: LEFT KNEE ARTHROSCOPY WITH PARTIAL MEDIAL MENISECTOMY (Left Knee)  Patient location during evaluation: PACU Anesthesia Type: General Level of consciousness: awake and alert Pain management: pain level controlled Vital Signs Assessment: post-procedure vital signs reviewed and stable Respiratory status: spontaneous breathing and respiratory function stable Cardiovascular status: stable Anesthetic complications: no     Last Vitals:  Vitals:   10/22/18 1605 10/22/18 1610  BP: 122/70   Pulse: 68 78  Resp: 13 (!) 22  Temp:    SpO2: 96% 98%    Last Pain:  Vitals:   10/22/18 1610  TempSrc:   PainSc: 6                  KEPHART,WILLIAM K

## 2018-10-22 NOTE — H&P (Signed)
Paper H&P to be scanned into permanent record. H&P reviewed. No significant changes noted.  

## 2018-10-23 ENCOUNTER — Encounter: Payer: Self-pay | Admitting: Orthopedic Surgery

## 2020-02-25 ENCOUNTER — Other Ambulatory Visit: Payer: Self-pay | Admitting: Physician Assistant

## 2020-02-25 DIAGNOSIS — Z90710 Acquired absence of both cervix and uterus: Secondary | ICD-10-CM

## 2020-02-25 DIAGNOSIS — R61 Generalized hyperhidrosis: Secondary | ICD-10-CM

## 2020-02-28 ENCOUNTER — Ambulatory Visit: Payer: 59

## 2020-06-27 IMAGING — MR MRI OF THE LEFT KNEE WITHOUT CONTRAST
6 series · 40 of 40 positions shown · non-contrast
Comparison: None.

CLINICAL DATA: Acute on chronic knee pain.  Work injury in [place].

EXAM:
MRI OF THE LEFT KNEE WITHOUT CONTRAST
TECHNIQUE: Multiplanar, multisequence MR imaging of the knee was performed. No
intravenous contrast was administered.

[Series 8: T2 fat-sat · axial · left · 4.0mm · 0.50mm/px · z∈[-97,+28]mm · 7 of 26 slices shown (1 of 3)]
[im 1/26]
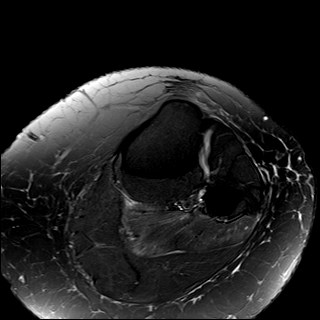
[im 5/26]
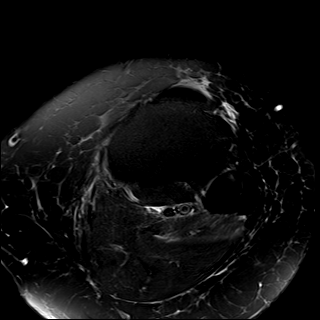
[im 9/26]
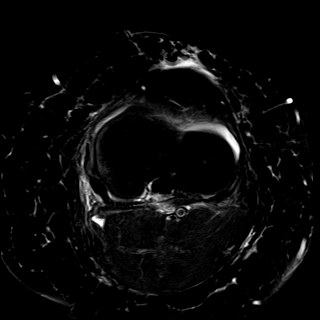
[im 13/26]
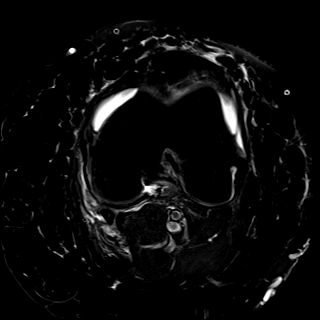
[im 17/26]
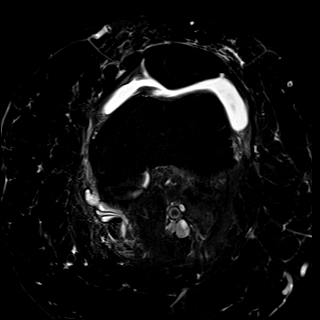
[im 21/26]
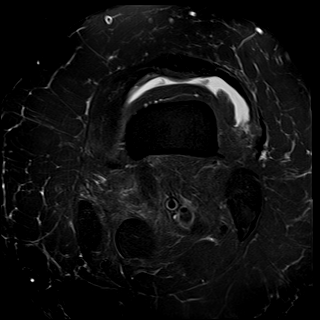
[im 26/26]
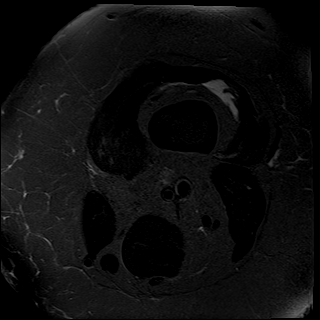

[Series 9: T1 · coronal · left · 4.0mm · 0.59mm/px · 6 of 30 slices shown]
[im 1/30]
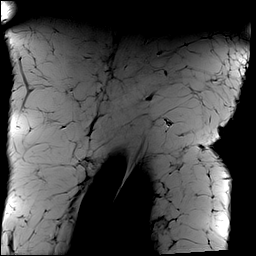
[im 6/30]
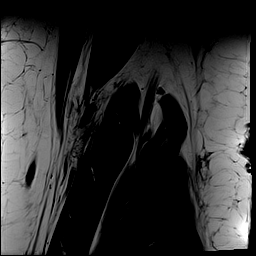
[im 12/30]
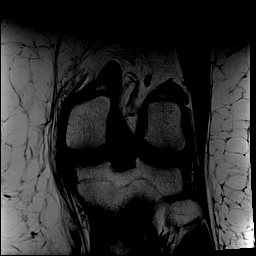
[im 18/30]
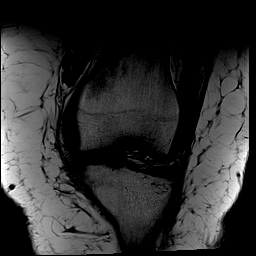
[im 24/30]
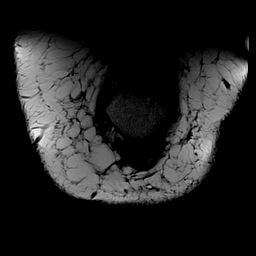
[im 30/30]
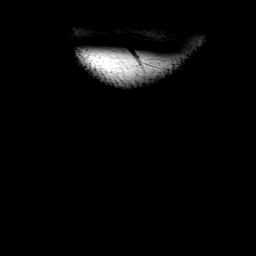

[Series 10: T2 fat-sat · coronal · left · 4.0mm · 0.59mm/px · 6 of 30 slices shown (2 of 3)]
[im 1/30]
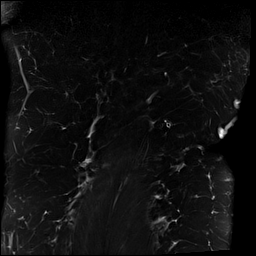
[im 6/30]
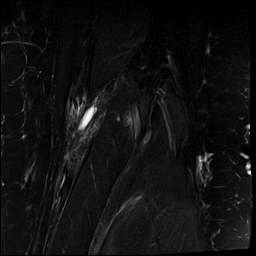
[im 12/30]
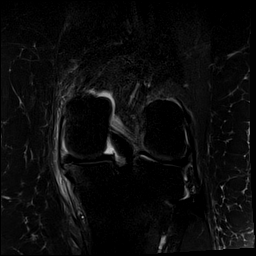
[im 18/30]
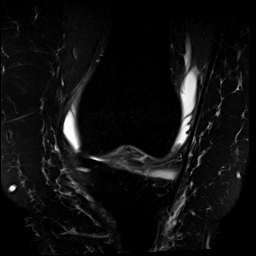
[im 24/30]
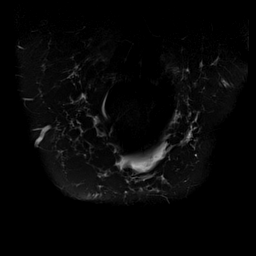
[im 30/30]
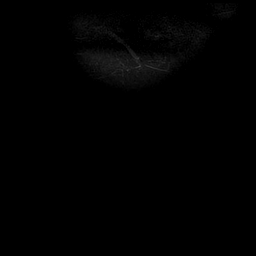

[Series 11: PD fat-sat · coronal · left · 4.0mm · 0.59mm/px · 6 of 30 slices shown (1 of 2)]
[im 1/30]
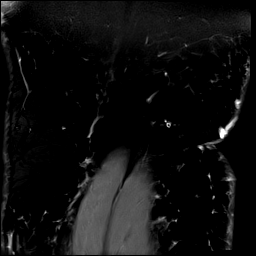
[im 6/30]
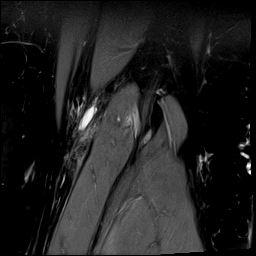
[im 12/30]
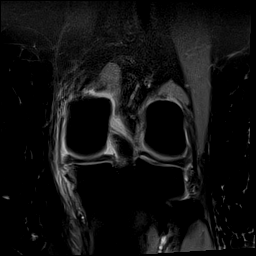
[im 18/30]
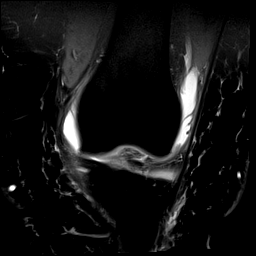
[im 24/30]
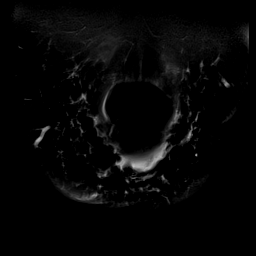
[im 30/30]
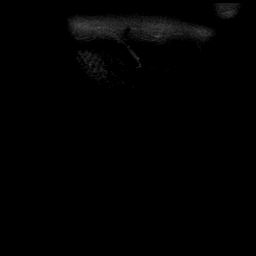

[Series 12: PD fat-sat · sagittal · left · 3.0mm · 0.59mm/px · 7 of 33 slices shown (2 of 2)]
[im 1/33]
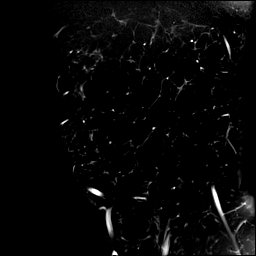
[im 6/33]
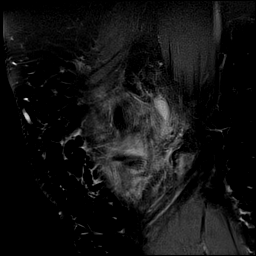
[im 11/33]
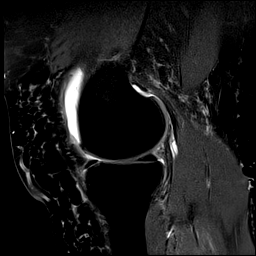
[im 17/33]
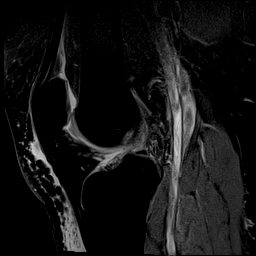
[im 22/33]
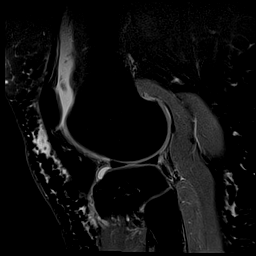
[im 27/33]
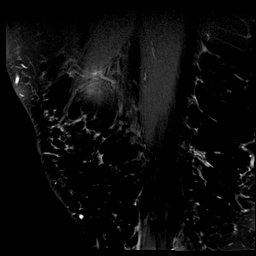
[im 33/33]
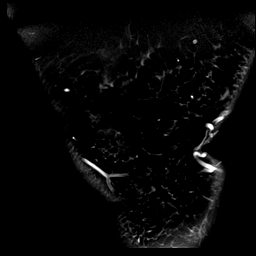

[Series 13: T2 fat-sat · sagittal · left · 3.0mm · 0.59mm/px · 8 of 36 slices shown (3 of 3)]
[im 1/36]
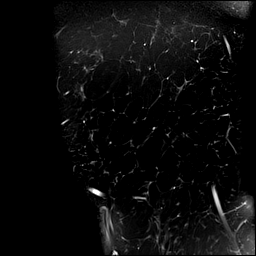
[im 6/36]
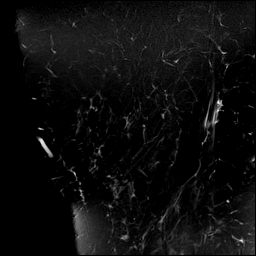
[im 11/36]
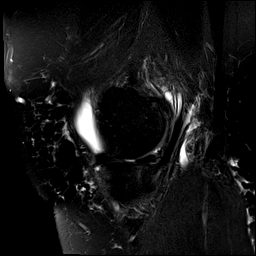
[im 16/36]
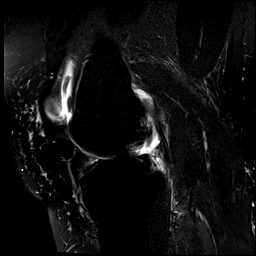
[im 21/36]
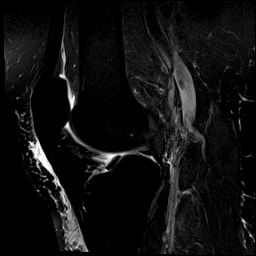
[im 26/36]
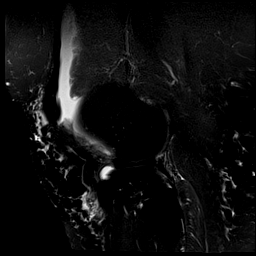
[im 31/36]
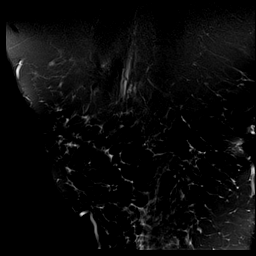
[im 36/36]
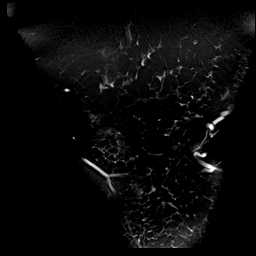

[40 of 40 positions shown; findings below may reference images not displayed]

FINDINGS: MENISCI

Medial meniscus: Radial tear of the body. Small focal longitudinal
tears of the posterior horn femoral and tibial surfaces. Borderline
extrusion of the body.

Lateral meniscus:  Intact.

LIGAMENTS

Cruciates:  Intact ACL and PCL.

Collaterals: Medial collateral ligament is intact. Mild edema
adjacent to the MCL. Lateral collateral ligament complex is intact.

CARTILAGE

Patellofemoral:  No chondral defect.

Medial:  No chondral defect.

Lateral:  No chondral defect.

Joint: Small joint effusion. Normal Hoffa's fat. No plical
thickening.

Popliteal Fossa:  Tiny Baker cyst.  Intact popliteus tendon.

Extensor Mechanism: Intact quadriceps tendon and patellar tendon.
Intact medial and lateral patellar retinaculum. Intact MPFL.

Bones: No focal marrow signal abnormality. No fracture or
dislocation.

Other: None.
IMPRESSION: 1. Radial tear of the medial meniscus body. Small longitudinal tears
of the posterior horn.
2. Mild edema tracks adjacent to the MCL. This can be incidental but
in the appropriate clinical circumstance could represent grade 1
sprain.
